# Patient Record
Sex: Male | Born: 1940 | Race: White | Hispanic: No | Smoking: Former smoker
Health system: Southern US, Community
[De-identification: ages and names within clinical notes are randomized; demographics above are authoritative.]

## PROBLEM LIST (undated history)

## (undated) DIAGNOSIS — Z8669 Personal history of other diseases of the nervous system and sense organs: Secondary | ICD-10-CM

## (undated) DIAGNOSIS — I10 Essential (primary) hypertension: Secondary | ICD-10-CM

## (undated) DIAGNOSIS — E7800 Pure hypercholesterolemia, unspecified: Secondary | ICD-10-CM

## (undated) DIAGNOSIS — N529 Male erectile dysfunction, unspecified: Secondary | ICD-10-CM

## (undated) DIAGNOSIS — K8020 Calculus of gallbladder without cholecystitis without obstruction: Secondary | ICD-10-CM

## (undated) DIAGNOSIS — R0609 Other forms of dyspnea: Secondary | ICD-10-CM

## (undated) DIAGNOSIS — J449 Chronic obstructive pulmonary disease, unspecified: Secondary | ICD-10-CM

## (undated) DIAGNOSIS — K625 Hemorrhage of anus and rectum: Secondary | ICD-10-CM

## (undated) DIAGNOSIS — G4700 Insomnia, unspecified: Secondary | ICD-10-CM

## (undated) HISTORY — DX: Pure hypercholesterolemia, unspecified: E78.00

## (undated) HISTORY — DX: Other forms of dyspnea: R06.09

## (undated) HISTORY — DX: Hemorrhage of anus and rectum: K62.5

## (undated) HISTORY — DX: Calculus of gallbladder without cholecystitis without obstruction: K80.20

## (undated) HISTORY — DX: Chronic obstructive pulmonary disease, unspecified: J44.9

## (undated) HISTORY — PX: OTHER SURGICAL HISTORY: SHX169

## (undated) HISTORY — DX: Insomnia, unspecified: G47.00

## (undated) HISTORY — DX: Male erectile dysfunction, unspecified: N52.9

## (undated) HISTORY — DX: Personal history of other diseases of the nervous system and sense organs: Z86.69

## (undated) HISTORY — DX: Essential (primary) hypertension: I10

---

## 2000-08-18 ENCOUNTER — Encounter: Payer: Self-pay | Admitting: Family Medicine

## 2000-08-18 ENCOUNTER — Ambulatory Visit (HOSPITAL_COMMUNITY): Admission: RE | Admit: 2000-08-18 | Discharge: 2000-08-18 | Payer: Self-pay | Admitting: Family Medicine

## 2001-09-19 ENCOUNTER — Emergency Department (HOSPITAL_COMMUNITY): Admission: EM | Admit: 2001-09-19 | Discharge: 2001-09-19 | Payer: Self-pay | Admitting: Emergency Medicine

## 2001-09-28 ENCOUNTER — Emergency Department (HOSPITAL_COMMUNITY): Admission: EM | Admit: 2001-09-28 | Discharge: 2001-09-28 | Payer: Self-pay | Admitting: Emergency Medicine

## 2002-12-13 HISTORY — PX: COLONOSCOPY: SHX174

## 2002-12-24 ENCOUNTER — Ambulatory Visit (HOSPITAL_COMMUNITY): Admission: RE | Admit: 2002-12-24 | Discharge: 2002-12-24 | Payer: Self-pay | Admitting: Internal Medicine

## 2004-06-15 ENCOUNTER — Ambulatory Visit (HOSPITAL_COMMUNITY): Admission: RE | Admit: 2004-06-15 | Discharge: 2004-06-15 | Payer: Self-pay | Admitting: Family Medicine

## 2007-04-03 ENCOUNTER — Ambulatory Visit (HOSPITAL_COMMUNITY): Admission: RE | Admit: 2007-04-03 | Discharge: 2007-04-03 | Payer: Self-pay | Admitting: Family Medicine

## 2007-04-08 ENCOUNTER — Ambulatory Visit (HOSPITAL_COMMUNITY): Admission: RE | Admit: 2007-04-08 | Discharge: 2007-04-08 | Payer: Self-pay | Admitting: Family Medicine

## 2007-04-14 ENCOUNTER — Ambulatory Visit (HOSPITAL_COMMUNITY): Admission: RE | Admit: 2007-04-14 | Discharge: 2007-04-14 | Payer: Self-pay | Admitting: Family Medicine

## 2009-01-30 ENCOUNTER — Encounter (HOSPITAL_COMMUNITY): Admission: RE | Admit: 2009-01-30 | Discharge: 2009-02-08 | Payer: Self-pay | Admitting: Pediatrics

## 2010-02-11 DIAGNOSIS — Z8669 Personal history of other diseases of the nervous system and sense organs: Secondary | ICD-10-CM

## 2010-02-11 HISTORY — DX: Personal history of other diseases of the nervous system and sense organs: Z86.69

## 2010-06-29 NOTE — Op Note (Signed)
NAME:  Jerome Gonzalez, Jerome Gonzalez                             ACCOUNT NO.:  [id number]   MEDICAL RECORD NO.:  [id number]                   PATIENT TYPE:  AMB   LOCATION:  DAY                                  FACILITY:  [facility]   PHYSICIAN:  R. Roetta Sessions, M.D.              DATE OF BIRTH:  05-16-1940   DATE OF PROCEDURE:  12/24/2002  DATE OF DISCHARGE:                                 OPERATIVE REPORT   PROCEDURE:  Screening colonoscopy.   ENDOSCOPIST:  Gerrit Friends. Rourk, M.D.   INDICATIONS FOR PROCEDURE:  The patient is a 70 year old gentleman who has  never had his lower GI tract imaged. He is sent over at the courtesy of Dr.  Nicoletta Ba for colorectal cancer screening.  Mr. Cocuzza has never had a  colonoscopy.  There is no family history of colorectal neoplasia.  He tells  me that he has a very small volume hematochezia with almost every stool and  this has been the case for many years.  He has 1 formed stool daily.  He  denies constipation or diarrhea. Colonoscopy is now being done. This  approach has been discussed with the patient at length at the bedside.  The  potential risks, benefits, and alternatives have been reviewed, questions  answered. He is agreeable.  Please see my handwritten H&P for more  information.   PROCEDURE NOTE:  O2 saturation, blood pressure, pulse and respirations were  monitored throughout the entire procedure.  Conscious sedation: Versed 5 mg IV, Demerol 100 mg IV in divided doses.   INSTRUMENT:  Olympus video chip adult colonoscope.   FINDINGS:  A digital rectal exam revealed no abnormalities.   ENDOSCOPIC FINDINGS:  The prep was adequate.   RECTUM:  Examination of the rectal mucosa including the retroflex view of  the anal verge and on-face view of the anal canal revealed friable anal  canal hemorrhoids; otherwise the rectal mucosa appeared normal.   COLON:  The colonic mucosa was surveyed from the rectosigmoid junction  through the left transverse and right  colon to the area of the appendiceal  orifice, ileocecal valve, and cecum.  These structures were well seen and  photographed for the record.  The patient had a few scattered left-sided  diverticula.  The remainder of the colonic mucosa to the cecum appeared  normal.   From the level of the cecum and ileocecal valve the scope was slowly  withdrawn.  All previously mentioned mucosal surfaces were again seen; no  other abnormalities were observed.  The patient tolerated the procedure well  and was reacted in endoscopy.   IMPRESSION:  1. Friable anal canal hemorrhoids otherwise normal rectum.  2. Left-sided diverticula.  3. The remainder of the colonic mucosa appeared normal.   RECOMMENDATIONS:  1. Hemorrhoid/diverticulosis literature provided to Mr. Near.  2. A 10-day course of Anusol suppositories HC, 1 per rectum at bedtime.  3. Repeat colonoscopy in 10 years.      ___________________________________________                                            Jonathon Bellows, M.D.   RMR/MEDQ  D:  12/24/2002  T:  12/24/2002  Job:  [id number]   cc:   Jeoffrey Massed, M.D.  [facility]  [place], [place] [address]  Fax: [phone]

## 2010-11-01 LAB — PULMONARY FUNCTION TEST

## 2010-11-12 DIAGNOSIS — R0600 Dyspnea, unspecified: Secondary | ICD-10-CM

## 2010-11-12 DIAGNOSIS — R0609 Other forms of dyspnea: Secondary | ICD-10-CM

## 2010-11-12 HISTORY — DX: Dyspnea, unspecified: R06.00

## 2010-11-12 HISTORY — DX: Other forms of dyspnea: R06.09

## 2011-04-12 DIAGNOSIS — K625 Hemorrhage of anus and rectum: Secondary | ICD-10-CM

## 2011-04-12 HISTORY — DX: Hemorrhage of anus and rectum: K62.5

## 2011-04-25 LAB — CBC WITH DIFFERENTIAL/PLATELET
Hemoglobin: 15.3 g/dL (ref 13.5–17.5)
MCV: 95.3 fL
RBC: 4.59
WBC: 6.1
platelet count: 228

## 2011-04-29 ENCOUNTER — Encounter: Payer: Self-pay | Admitting: Gastroenterology

## 2011-04-29 ENCOUNTER — Ambulatory Visit (INDEPENDENT_AMBULATORY_CARE_PROVIDER_SITE_OTHER): Payer: Medicare Other | Admitting: Gastroenterology

## 2011-04-29 VITALS — HR 93 | Temp 98.3°F | Ht 74.0 in | Wt 300.4 lb

## 2011-04-29 DIAGNOSIS — K921 Melena: Secondary | ICD-10-CM

## 2011-04-29 NOTE — Patient Instructions (Signed)
We have set you up for a colonoscopy with Dr. Rourk in the near future.  Further recommendations to follow once this is completed.  

## 2011-04-29 NOTE — Progress Notes (Signed)
Primary Care Physician:  Vivia Ewing, MD, MD Primary Gastroenterologist:  Dr. Jena Gauss   Chief Complaint  Patient presents with  . Rectal Bleeding    HPI:   [name] is a pleasant 71 year old male who presents today at the request of Dr. Milford Cage secondary to rectal bleeding. Last TCS in Nov 2004 with friable anal canal and left-sided diverticula. He reports intermittent, low-volume, painless hematochezia. No change in bowel habits, no diarrhea. Denies abdominal pain, wt loss, lack of appetite. No FH of colon cancer. Denies any N/V. Has very rare indigestion secondary to food choices. Denies dysphagia.    Past Medical History  Diagnosis Date  . Hypercholesterolemia   . Hypertension   . Diabetes mellitus     Past Surgical History  Procedure Date  . Foot surgeries     X 3, as child  . Colonoscopy Nov 2004    RMR: friable anal canal, left-sided diverticula    Current Outpatient Prescriptions  Medication Sig Dispense Refill  . aspirin 81 MG tablet Take 81 mg by mouth daily.      . fish oil-omega-3 fatty acids 1000 MG capsule Take 2 g by mouth daily.      . fluticasone (FLONASE) 50 MCG/ACT nasal spray Place 2 sprays into the nose daily.      Marland Kitchen lisinopril (PRINIVIL,ZESTRIL) 20 MG tablet Take 20 mg by mouth daily.       Marland Kitchen lovastatin (MEVACOR) 40 MG tablet Take 40 mg by mouth at bedtime.       . metFORMIN (GLUCOPHAGE) 500 MG tablet Take 500 mg by mouth 2 (two) times daily with a meal.       . Multiple Vitamin (MULTIVITAMIN) capsule Take 1 capsule by mouth daily.      . nabumetone (RELAFEN) 750 MG tablet Take 750 mg by mouth 2 (two) times daily.         Allergies as of 04/29/2011  . (No Known Allergies)    Family History  Problem Relation Age of Onset  . Colon cancer Neg Hx     History   Social History  . Marital Status: Divorced    Spouse Name: N/A    Number of Children: N/A  . Years of Education: N/A   Occupational History  . Not on file.   Social History Main Topics    . Smoking status: Former Smoker -- 1.0 packs/day    Types: Cigarettes  . Smokeless tobacco: Former Neurosurgeon    Quit date: 03/01/1983  . Alcohol Use: No  . Drug Use: No  . Sexually Active: Not on file   Other Topics Concern  . Not on file   Social History Narrative  . No narrative on file    Review of Systems: Gen: Denies any fever, chills, fatigue, weight loss, lack of appetite.  CV: Denies chest pain, heart palpitations, peripheral edema, syncope.  Resp: Denies shortness of breath at rest or with exertion. Denies wheezing or cough.  GI: Denies dysphagia or odynophagia. Denies jaundice, hematemesis, fecal incontinence. GU : Denies urinary burning, urinary frequency, urinary hesitancy MS: Denies joint pain, muscle weakness, cramps, or limitation of movement.  Derm: Denies rash, itching, dry skin Psych: Denies depression, anxiety, memory loss, and confusion Heme: Denies bruising, bleeding, and enlarged lymph nodes.  Physical Exam: Pulse 93  Temp(Src) 98.3 F (36.8 C) (Temporal)  Ht 6\' 2"  (1.88 m)  Wt 300 lb 6.4 oz (136.261 kg)  BMI 38.57 kg/m2 General:   Alert and oriented. Pleasant and cooperative.  Well-nourished and well-developed.  Head:  Normocephalic and atraumatic. Eyes:  Without icterus, sclera clear and conjunctiva pink.  Ears:  Normal auditory acuity. Nose:  No deformity, discharge,  or lesions. Mouth:  No deformity or lesions, oral mucosa pink.  Neck:  Supple, without mass or thyromegaly. Lungs:  Clear to auscultation bilaterally. No wheezes, rales, or rhonchi. No distress.  Heart:  S1, S2 present without murmurs appreciated.  Abdomen:  +BS, soft, non-tender and non-distended. No HSM noted. No guarding or rebound. No masses appreciated.  Rectal:  Deferred  Msk:  Symmetrical without gross deformities. Normal posture. Extremities:  Without clubbing or edema. Neurologic:  Alert and  oriented x4;  grossly normal neurologically. Skin:  Intact without significant  lesions or rashes. Cervical Nodes:  No significant cervical adenopathy. Psych:  Alert and cooperative. Normal mood and affect.

## 2011-04-30 ENCOUNTER — Encounter: Payer: Self-pay | Admitting: Gastroenterology

## 2011-04-30 DIAGNOSIS — K921 Melena: Secondary | ICD-10-CM | POA: Insufficient documentation

## 2011-04-30 NOTE — Assessment & Plan Note (Signed)
71 year old male with low-volume, painless hematochezia without any associated symptoms such as abdominal pain, change in bowel habits. Last TCS in 2004 with friable anal canal and left-sided diverticula. At this point, it is highly unlikely this is due to an underlying occult process; however, enough time has passed between lower GI evaluations that an updated colonoscopy could be warranted. He would like to proceed in this direction.   Proceed with TCS with Dr. Jena Gauss in near future: the risks, benefits, and alternatives have been discussed with the patient in detail. The patient states understanding and desires to proceed.

## 2011-04-30 NOTE — Progress Notes (Signed)
Faxed to PCP

## 2011-05-01 ENCOUNTER — Other Ambulatory Visit: Payer: Self-pay

## 2011-05-01 DIAGNOSIS — K625 Hemorrhage of anus and rectum: Secondary | ICD-10-CM

## 2011-05-02 ENCOUNTER — Telehealth: Payer: Self-pay

## 2011-05-02 NOTE — Telephone Encounter (Signed)
Pt is scheduled for his colonoscopy with Dr. Jena Gauss on 05/08/2011 @ 1:15 PM. Jeanene Erb and informed pt of his date/time. Rx and instructions faxed to [facility] in [place].

## 2011-05-07 ENCOUNTER — Telehealth: Payer: Self-pay | Admitting: Internal Medicine

## 2011-05-07 NOTE — Telephone Encounter (Signed)
Pt cancelled his TCS and wanted to try medication first. He said AS was going to call something in for him but Rx does not have Rx. Please call patient at [phone]

## 2011-05-08 ENCOUNTER — Ambulatory Visit (HOSPITAL_COMMUNITY): Admission: RE | Admit: 2011-05-08 | Payer: Medicare Other | Source: Ambulatory Visit | Admitting: Internal Medicine

## 2011-05-08 ENCOUNTER — Encounter (HOSPITAL_COMMUNITY): Admission: RE | Payer: Self-pay | Source: Ambulatory Visit

## 2011-05-08 SURGERY — COLONOSCOPY
Anesthesia: Moderate Sedation

## 2011-05-08 MED ORDER — HYDROCORTISONE ACETATE 25 MG RE SUPP: 25.0000 mg | Freq: Two times a day (BID) | RECTAL | Status: DC

## 2011-05-08 NOTE — Telephone Encounter (Signed)
I sent in a 7 day course of BID anusol suppositories.  Let's have pt come back in 6 weeks for reassessment.

## 2011-05-09 NOTE — Telephone Encounter (Signed)
Pt is aware and f/u opv for 06/19/11 w/AS

## 2011-06-19 ENCOUNTER — Ambulatory Visit (INDEPENDENT_AMBULATORY_CARE_PROVIDER_SITE_OTHER): Payer: Medicare Other | Admitting: Gastroenterology

## 2011-06-19 ENCOUNTER — Encounter: Payer: Self-pay | Admitting: Gastroenterology

## 2011-06-19 VITALS — BP 156/86 | HR 84 | Temp 98.4°F | Ht 74.0 in | Wt 298.2 lb

## 2011-06-19 DIAGNOSIS — K5900 Constipation, unspecified: Secondary | ICD-10-CM | POA: Insufficient documentation

## 2011-06-19 DIAGNOSIS — K921 Melena: Secondary | ICD-10-CM

## 2011-06-19 NOTE — Assessment & Plan Note (Signed)
Rare. Associated with eating "too much cheese". Discussed HF diet. Avoid cheese. May take stool softener prn. Miralax prn. 6 mos f/u.

## 2011-06-19 NOTE — Progress Notes (Signed)
Referring Provider: Ara Kussmaul, MD Primary Care Physician:  Vivia Ewing, MD, MD Primary Gastroenterologist: Dr. Jena Gauss   Chief Complaint  Patient presents with  . Follow-up    HPI:   [name] is a pleasant 71 year old male who presents today in follow-up for low-volume, painless hematochezia. Seen March 2013 and set up for TCS. However, pt decided to try anusol suppositires first. This has significantly improved his symptoms. Notes almost complete resolution of rectal bleeding. Rare constipation, usually associated with too much cheese.  Last TCS in Nov 2004 with friable anal canal and left-sided diverticula.    Denies abdominal pain, wt loss, lack of appetite. No FH of colon cancer. Denies any N/V. Has very rare indigestion secondary to food choices. Denies dysphagia.   Labs reviewed from Dr. Milford Cage: Hgb 15.14 April 2011.   Past Medical History  Diagnosis Date  . Hypercholesterolemia   . Hypertension   . Diabetes mellitus     Past Surgical History  Procedure Date  . Foot surgeries     X 3, as child  . Colonoscopy Nov 2004    RMR: friable anal canal, left-sided diverticula    Current Outpatient Prescriptions  Medication Sig Dispense Refill  . aspirin 81 MG tablet Take 81 mg by mouth daily.      . fish oil-omega-3 fatty acids 1000 MG capsule Take 2 g by mouth daily.      . fluticasone (FLONASE) 50 MCG/ACT nasal spray Place 2 sprays into the nose daily.      . hydrocortisone (ANUSOL-HC) 25 MG suppository Place 1 suppository (25 mg total) rectally every 12 (twelve) hours. For 7 days  14 suppository  0  . lisinopril (PRINIVIL,ZESTRIL) 20 MG tablet Take 20 mg by mouth daily.       Marland Kitchen lovastatin (MEVACOR) 40 MG tablet Take 40 mg by mouth at bedtime.       . metFORMIN (GLUCOPHAGE) 500 MG tablet Take 500 mg by mouth 2 (two) times daily with a meal.       . Multiple Vitamin (MULTIVITAMIN) capsule Take 1 capsule by mouth daily.      . nabumetone (RELAFEN) 750 MG tablet Take 750 mg by  mouth 2 (two) times daily.         Allergies as of 06/19/2011  . (No Known Allergies)    Family History  Problem Relation Age of Onset  . Colon cancer Neg Hx     History   Social History  . Marital Status: Divorced    Spouse Name: N/A    Number of Children: N/A  . Years of Education: N/A   Social History Main Topics  . Smoking status: Former Smoker -- 1.0 packs/day    Types: Cigarettes  . Smokeless tobacco: Former Neurosurgeon    Quit date: 03/01/1983  . Alcohol Use: No  . Drug Use: No  . Sexually Active: None   Other Topics Concern  . None   Social History Narrative  . None    Review of Systems: Gen: Denies fever, chills, anorexia. Denies fatigue, weakness, weight loss.  CV: Denies chest pain, palpitations, syncope, peripheral edema, and claudication. Resp: Denies dyspnea at rest, cough, wheezing, coughing up blood, and pleurisy. GI: Denies vomiting blood, jaundice, and fecal incontinence.   Denies dysphagia or odynophagia. Derm: Denies rash, itching, dry skin Psych: Denies depression, anxiety, memory loss, confusion. No homicidal or suicidal ideation.  Heme: Denies bruising, bleeding, and enlarged lymph nodes.  Physical Exam: BP 156/86  Pulse 84  Temp(Src) 98.4 F (36.9 C) (Temporal)  Ht 6\' 2"  (1.88 m)  Wt 298 lb 3.2 oz (135.263 kg)  BMI 38.29 kg/m2 General:   Alert and oriented. No distress noted. Pleasant and cooperative.  Head:  Normocephalic and atraumatic. Eyes:  Conjuctiva clear without scleral icterus. Mouth:  Oral mucosa pink and moist. Good dentition. No lesions. Heart:  S1, S2 present without murmurs, rubs, or gallops. Regular rate and rhythm. Abdomen:  +BS, soft, non-tender and non-distended. No rebound or guarding. No HSM or masses noted. Msk:  Symmetrical without gross deformities. Normal posture. Extremities:  Without edema. Neurologic:  Alert and  oriented x4;  grossly normal neurologically. Skin:  Intact without significant lesions or  rashes. Psych:  Alert and cooperative. Normal mood and affect.

## 2011-06-19 NOTE — Patient Instructions (Signed)
Eat a high fiber diet and drink plenty of water as you are doing. Avoid foods that constipate you (like cheese)  You may take an over-the-counter stool softener 1-2 times a day as needed. If you really need help with constipation, you may take Miralax. If you notice any significant change in your bowel habits, call our office. If you notice any further or increased bleeding, call us as well.  Otherwise, we will see you back in 6 months!

## 2011-06-19 NOTE — Assessment & Plan Note (Signed)
71 year old male initially seen in consult March 2013 due to painless low-volume hematochezia. No other associated symptoms. Last TCS 2004 with friable anal canal and left-sided diverticula. Was set up for TCS but called to cancel and try suppositories instead.  Anusol prescribed, with almost complete resolution of rectal bleeding. Notes only once incidence with constipation after straining. No anemia March 2013. Likely due to benign source. Pt would like to hold off on lower GI evaluation, which I believe at this point is not out of the question. I have discussed with him signs/symptoms to report. Will definitely need TCS next year. Proceed with TCS if further rectal bleeding.

## 2011-06-20 ENCOUNTER — Encounter: Payer: Self-pay | Admitting: Internal Medicine

## 2011-06-20 NOTE — Progress Notes (Signed)
Faxed to PCP

## 2011-06-20 NOTE — Progress Notes (Signed)
Please inform pt CBC normal. No anemia. Contact us if further rectal bleeding. Return as already discussed. Hold off on TCS for now, but he will definitely need if any further rectal bleeding, change in bowel habits. TCS for next year as planned.

## 2011-06-25 NOTE — Progress Notes (Signed)
Tried to call pt- LMOM 

## 2011-06-26 NOTE — Progress Notes (Signed)
Pt called this morning and is aware of results,

## 2011-06-27 ENCOUNTER — Encounter: Payer: Self-pay | Admitting: General Practice

## 2011-06-27 ENCOUNTER — Telehealth: Payer: Self-pay | Admitting: Internal Medicine

## 2011-06-27 NOTE — Telephone Encounter (Signed)
Spoke with pt. Still dealing with constipation. Add metamucil daily. Stool softener 1-2 daily. Miralax every other day. Discussed with him need for titrating as necessary. Pt due for TCS next year. Wanted to hold off on TCS, see OV note.  Pt to return in 6 mos.

## 2011-06-27 NOTE — Telephone Encounter (Signed)
Patient called this morning wanting to speak with AS. Please call patient back at [phone]

## 2011-10-22 ENCOUNTER — Encounter: Payer: Self-pay | Admitting: Family Medicine

## 2011-10-22 ENCOUNTER — Ambulatory Visit (INDEPENDENT_AMBULATORY_CARE_PROVIDER_SITE_OTHER): Payer: Medicare Other | Admitting: Family Medicine

## 2011-10-22 VITALS — BP 162/98 | HR 79 | Temp 97.4°F | Ht 74.0 in | Wt 295.0 lb

## 2011-10-22 DIAGNOSIS — I10 Essential (primary) hypertension: Secondary | ICD-10-CM | POA: Insufficient documentation

## 2011-10-22 DIAGNOSIS — Z23 Encounter for immunization: Secondary | ICD-10-CM

## 2011-10-22 DIAGNOSIS — J209 Acute bronchitis, unspecified: Secondary | ICD-10-CM

## 2011-10-22 DIAGNOSIS — E782 Mixed hyperlipidemia: Secondary | ICD-10-CM

## 2011-10-22 DIAGNOSIS — E119 Type 2 diabetes mellitus without complications: Secondary | ICD-10-CM | POA: Insufficient documentation

## 2011-10-22 LAB — CBC WITH DIFFERENTIAL/PLATELET
Basophils Absolute: 0 10*3/uL (ref 0.0–0.1)
Basophils Relative: 0.6 % (ref 0.0–3.0)
Eosinophils Absolute: 0.1 10*3/uL (ref 0.0–0.7)
HCT: 47.8 % (ref 39.0–52.0)
Hemoglobin: 15.9 g/dL (ref 13.0–17.0)
Lymphs Abs: 1.3 10*3/uL (ref 0.7–4.0)
MCHC: 33.2 g/dL (ref 30.0–36.0)
Neutro Abs: 5.1 10*3/uL (ref 1.4–7.7)
RBC: 4.77 Mil/uL (ref 4.22–5.81)
RDW: 13.5 % (ref 11.5–14.6)

## 2011-10-22 LAB — COMPREHENSIVE METABOLIC PANEL
ALT: 51 U/L (ref 0–53)
Albumin: 4.5 g/dL (ref 3.5–5.2)
CO2: 28 mEq/L (ref 19–32)
Calcium: 10 mg/dL (ref 8.4–10.5)
Chloride: 103 mEq/L (ref 96–112)
GFR: 87.43 mL/min (ref >60.00)
Glucose, Bld: 118 mg/dL — ABNORMAL HIGH (ref 70–99)
Potassium: 4.9 mEq/L (ref 3.5–5.1)
Sodium: 139 mEq/L (ref 135–145)
Total Protein: 7.5 g/dL (ref 6.0–8.3)

## 2011-10-22 LAB — LIPID PANEL: Cholesterol: 205 mg/dL — ABNORMAL HIGH (ref 0–200)

## 2011-10-22 MED ORDER — LOVASTATIN 40 MG PO TABS: ORAL_TABLET | ORAL | Status: DC

## 2011-10-22 MED ORDER — ZOSTER VACCINE LIVE 19400 UNT/0.65ML ~~LOC~~ SOLR: 0.6500 mL | Freq: Once | SUBCUTANEOUS | Status: AC

## 2011-10-22 MED ORDER — HYDROCODONE-HOMATROPINE 5-1.5 MG/5ML PO SYRP: ORAL_SOLUTION | ORAL | Status: AC

## 2011-10-22 MED ORDER — AZITHROMYCIN 250 MG PO TABS: 250.0000 mg | ORAL_TABLET | Freq: Every day | ORAL | Status: AC

## 2011-10-22 NOTE — Assessment & Plan Note (Addendum)
FLP and liver tests today. I told him to resume taking his mevacor 40mg  tab TWICE per day instead of qd b/c it sounds like his muscle cramp in right leg are unrelated to statin use.

## 2011-10-22 NOTE — Assessment & Plan Note (Signed)
Prolonged sx's, likely has mild underlying COPD. Z-pack rx'd, hycodan rx'd, mucinex DM recommended. Avoid OTC decongestant med due to possible bp effect.

## 2011-10-22 NOTE — Patient Instructions (Signed)
Buy OTC Mucinex DM and use for daytime cough/congestion. Avoid all OTC med that says "decongestant" on the box. Buy an automatic BP cuff (upper arm cuff, not a wrist cuff) at your pharmacy.

## 2011-10-22 NOTE — Assessment & Plan Note (Signed)
Check HbA1c today. Continue current meds. Get old records to see what monitoring may be needed at this juncture.  Last eye exam was 03/19/2011--will obtain record.

## 2011-10-22 NOTE — Assessment & Plan Note (Signed)
Uncontrolled today, likely affected by OTC sudafed. However, he does not monitor bp at home so I recommended he stop all OTC decongestants, buy bp cuff and monitor bp daily for 1 mo and return for review of these.  Continue current meds for now.  Check lytes/cr today.

## 2011-10-22 NOTE — Progress Notes (Signed)
Office Note 10/22/2011  CC:  Chief Complaint  Patient presents with  . Establish Care    cough, congestion    HPI:  Jerome Gonzalez is a 71 y.o. White male who is here to establish care.   Patient's most recent primary MD: Dr. Milford Cage at [facility] in [place].  I saw him there as well prior to leaving the practice in the fall of 2011. Old records in EPIC/HL were reviewed prior to or during today's visit.  Doing well except for recent respiratory complaints which have been 2 mo in duration: describes yellow/green sticky mucous when he coughs.  Denies SOB, wheeze, or chest tightness.  Flonase spray helps. Describes some nasal/sinus congestion.  Doesn't smoke: quit 1985. No fevers.  No ST or HA.  Has been taking OTC med with decongestant.  No HA, no dizziness, no vision changes. He has chronic hearing loss.    Past Medical History  Diagnosis Date  . Hypercholesterolemia   . Hypertension   . Diabetes mellitus   . COPD (chronic obstructive pulmonary disease)   . Cholelithiasis     Past Surgical History  Procedure Date  . Foot surgeries     X 3, as child  . Colonoscopy Nov 2004    RMR: friable anal canal, left-sided diverticula    Family History  Problem Relation Age of Onset  . Colon cancer Neg Hx     History   Social History  . Marital Status: Divorced    Spouse Name: N/A    Number of Children: N/A  . Years of Education: N/A   Occupational History  . Not on file.   Social History Main Topics  . Smoking status: Former Smoker -- 1.0 packs/day    Types: Cigarettes  . Smokeless tobacco: Former Neurosurgeon    Quit date: 03/01/1983  . Alcohol Use: No  . Drug Use: No  . Sexually Active: Not on file   Other Topics Concern  . Not on file   Social History Narrative  . No narrative on file    Outpatient Encounter Prescriptions as of 10/22/2011  Medication Sig Dispense Refill  . aspirin 81 MG tablet Take 81 mg by mouth daily.      . fish oil-omega-3 fatty acids  1000 MG capsule Take 2 g by mouth daily.      . fluticasone (FLONASE) 50 MCG/ACT nasal spray Place 2 sprays into the nose daily.      . hydrocortisone (ANUSOL-HC) 25 MG suppository Place 1 suppository (25 mg total) rectally every 12 (twelve) hours. For 7 days  14 suppository  0  . lisinopril (PRINIVIL,ZESTRIL) 20 MG tablet Take 20 mg by mouth daily.       Marland Kitchen lovastatin (MEVACOR) 40 MG tablet 1 tab po bid  180 tablet  1  . metFORMIN (GLUCOPHAGE) 500 MG tablet Take 500 mg by mouth 2 (two) times daily with a meal.       . Multiple Vitamin (MULTIVITAMIN) capsule Take 1 capsule by mouth daily.      . nabumetone (RELAFEN) 750 MG tablet Take 750 mg by mouth daily.       Marland Kitchen oxyCODONE-acetaminophen (PERCOCET/ROXICET) 5-325 MG per tablet       . DISCONTD: lovastatin (MEVACOR) 40 MG tablet Take 40 mg by mouth at bedtime.       Marland Kitchen azithromycin (ZITHROMAX) 250 MG tablet Take 1 tablet (250 mg total) by mouth daily.  6 each  0  . HYDROcodone-homatropine (HYCODAN) 5-1.5  MG/5ML syrup 1-2 tsp po q6h prn cough  120 mL  0  . zoster vaccine live, PF, (ZOSTAVAX) 16109 UNT/0.65ML injection Inject 19,400 Units into the skin once.  1 vial  0    No Known Allergies  ROS See HPI PE; Blood pressure 162/98, pulse 79, temperature 97.4 F (36.3 C), temperature source Temporal, height 6\' 2"  (1.88 m), weight 295 lb (133.811 kg), SpO2 94.00%. Gen: Alert, well appearing.  Patient is oriented to person, place, time, and situation. AFFECT: pleasant, lucid thought and speech. VS: noted--normal. Gen: alert, NAD, NONTOXIC APPEARING. HEENT: eyes without injection, drainage, or swelling.  Ears: EACs clear, TMs with normal light reflex and landmarks.  Nose: Clear rhinorrhea, with some dried, crusty exudate adherent to mildly injected mucosa.  No purulent d/c.  No paranasal sinus TTP.  No facial swelling.  Throat and mouth without focal lesion.  No pharyngial swelling, erythema, or exudate.   Neck: supple, no LAD.   LUNGS: CTA bilat,  diminished breath sounds diffusely, nonlabored resps.  Minimal, if any, prolongation of expiratory phase. CV: RRR, no m/r/g. EXT: no c/c/e SKIN: no rash   Pertinent labs:  None today  ASSESSMENT AND PLAN:   New/Transfer PT from [facility]:  Acute bronchitis Prolonged sx's, likely has mild underlying COPD. Z-pack rx'd, hycodan rx'd, mucinex DM recommended. Avoid OTC decongestant med due to possible bp effect.  HTN (hypertension), benign Uncontrolled today, likely affected by OTC sudafed. However, he does not monitor bp at home so I recommended he stop all OTC decongestants, buy bp cuff and monitor bp daily for 1 mo and return for review of these.  Continue current meds for now.  Check lytes/cr today.  Hyperlipemia, mixed FLP and liver tests today. I told him to resume taking his mevacor 40mg  tab TWICE per day instead of qd b/c it sounds like his muscle cramp in right leg are unrelated to statin use.  Type II or unspecified type diabetes mellitus without mention of complication, not stated as uncontrolled Check HbA1c today. Continue current meds. Get old records to see what monitoring may be needed at this juncture.  Last eye exam was 03/19/2011--will obtain record.   Zostavax rx given to pt today. Gave pneumovax and influenza vaccines here today.  Return in about 4 weeks (around 11/19/2011) for f/u HTN.

## 2011-10-28 ENCOUNTER — Other Ambulatory Visit: Payer: Self-pay | Admitting: Family Medicine

## 2011-10-28 MED ORDER — ATORVASTATIN CALCIUM 10 MG PO TABS: 10.0000 mg | ORAL_TABLET | Freq: Every day | ORAL | Status: DC

## 2011-10-29 ENCOUNTER — Encounter: Payer: Self-pay | Admitting: Family Medicine

## 2011-10-30 ENCOUNTER — Encounter: Payer: Self-pay | Admitting: Family Medicine

## 2011-10-30 NOTE — Telephone Encounter (Signed)
Immunizations updated with zostavax.  [facility] office for Dr. Nile Riggs will open at 1pm.  I will call back at that time to see when patients next eye exam should be.

## 2011-11-02 ENCOUNTER — Encounter: Payer: Self-pay | Admitting: Family Medicine

## 2011-11-05 ENCOUNTER — Encounter: Payer: Self-pay | Admitting: Family Medicine

## 2011-11-06 ENCOUNTER — Other Ambulatory Visit: Payer: Self-pay | Admitting: Family Medicine

## 2011-11-06 MED ORDER — LEVOFLOXACIN 500 MG PO TABS: 500.0000 mg | ORAL_TABLET | Freq: Every day | ORAL | Status: DC

## 2011-11-06 NOTE — Telephone Encounter (Signed)
Please advise 

## 2011-11-07 ENCOUNTER — Encounter: Payer: Self-pay | Admitting: Family Medicine

## 2011-11-12 ENCOUNTER — Telehealth: Payer: Self-pay | Admitting: Family Medicine

## 2011-11-12 ENCOUNTER — Encounter: Payer: Self-pay | Admitting: Family Medicine

## 2011-11-12 NOTE — Telephone Encounter (Signed)
Patient was expecting 6 Azithromyzin 250 MG but he received 10 Levofloxacin 500 MG. Are these the same meds? Patient is okay for a CB 11/13/11 not 11/12/11

## 2011-11-12 NOTE — Telephone Encounter (Signed)
Please advise mychart question? 

## 2011-11-13 NOTE — Telephone Encounter (Signed)
Notified pt meds are different.  We are using different med as first round was not completely effective.

## 2011-11-19 ENCOUNTER — Encounter: Payer: Self-pay | Admitting: Family Medicine

## 2011-11-19 ENCOUNTER — Encounter: Payer: Self-pay | Admitting: *Deleted

## 2011-11-20 ENCOUNTER — Encounter: Payer: Self-pay | Admitting: Family Medicine

## 2011-11-20 ENCOUNTER — Ambulatory Visit (INDEPENDENT_AMBULATORY_CARE_PROVIDER_SITE_OTHER): Payer: Medicare Other | Admitting: Family Medicine

## 2011-11-20 VITALS — BP 148/82 | HR 82 | Ht 74.0 in | Wt 295.0 lb

## 2011-11-20 DIAGNOSIS — I10 Essential (primary) hypertension: Secondary | ICD-10-CM

## 2011-11-20 NOTE — Progress Notes (Signed)
OFFICE NOTE  11/20/2011  CC:  Chief Complaint  Patient presents with  . Follow-up    HTN, DM     HPI: Patient is a 71 y.o. Caucasian male who is here for 1 mo f/u HTN.  Got his own cuff but measuring high when compared with ours today but he has a cuff that is questionably too small for his upper arm.  He feels well and has no physical complaints today. Review of home bp over the last 3 wks with this cuff show systolics avg 150s, diastolics avg 80s.  Pertinent PMH:  Past Medical History  Diagnosis Date  . Hypercholesterolemia   . Hypertension   . Diabetes mellitus   . COPD (chronic obstructive pulmonary disease)   . Cholelithiasis     MEDS:  Outpatient Prescriptions Prior to Visit  Medication Sig Dispense Refill  . aspirin 81 MG tablet Take 81 mg by mouth daily.      Marland Kitchen atorvastatin (LIPITOR) 10 MG tablet Take 1 tablet (10 mg total) by mouth daily.  90 tablet  0  . fish oil-omega-3 fatty acids 1000 MG capsule Take 2 g by mouth daily.      . fluticasone (FLONASE) 50 MCG/ACT nasal spray Place 2 sprays into the nose daily.      . hydrocortisone (ANUSOL-HC) 25 MG suppository Place 1 suppository (25 mg total) rectally every 12 (twelve) hours. For 7 days  14 suppository  0  . levofloxacin (LEVAQUIN) 500 MG tablet Take 1 tablet (500 mg total) by mouth daily.  10 tablet  0  . lisinopril (PRINIVIL,ZESTRIL) 20 MG tablet Take 20 mg by mouth daily.       . metFORMIN (GLUCOPHAGE) 500 MG tablet Take 500 mg by mouth 2 (two) times daily with a meal.       . Multiple Vitamin (MULTIVITAMIN) capsule Take 1 capsule by mouth daily.      . nabumetone (RELAFEN) 750 MG tablet Take 750 mg by mouth daily.       Marland Kitchen oxyCODONE-acetaminophen (PERCOCET/ROXICET) 5-325 MG per tablet         PE: Blood pressure 148/82, pulse 82, height 6\' 2"  (1.88 m), weight 295 lb (133.811 kg). Gen: Alert, well appearing.  Patient is oriented to person, place, time, and situation. No further exam today.  No results found  for this basename: TSH   Lab Results  Component Value Date   WBC 7.0 10/22/2011   HGB 15.9 10/22/2011   HCT 47.8 10/22/2011   MCV 100.1* 10/22/2011   PLT 251.0 10/22/2011   Lab Results  Component Value Date   CREATININE 0.9 10/22/2011   BUN 15 10/22/2011   NA 139 10/22/2011   K 4.9 10/22/2011   CL 103 10/22/2011   CO2 28 10/22/2011   Lab Results  Component Value Date   ALT 51 10/22/2011   AST 34 10/22/2011   ALKPHOS 55 10/22/2011   BILITOT 0.6 10/22/2011   Lab Results  Component Value Date   CHOL 205* 10/22/2011   Lab Results  Component Value Date   HDL 54.60 10/22/2011   No results found for this basename: LDLCALC   Lab Results  Component Value Date   TRIG 125.0 10/22/2011   Lab Results  Component Value Date   CHOLHDL 4 10/22/2011   No results found for this basename: PSA       IMPRESSION AND PLAN:  HTN (hypertension), benign His cuff doesn't seem to be all that small for his arm but his  readings with it have systolics 30 points higher consistently compared to both our automated bp machine and our wall mounted bp machine in room. He'll either take 30 points off the systolic readings he gets on his machine OR he'll look into trying a little bigger cuff with his home machine. No med changes today.  If he gets new machine or cuff he'll come in for nurse visit to compare with ours. F/u 1 mo to recheck BP and also get repeat FLP after being switched from mevacor to atorvastatin.   An After Visit Summary was printed and given to the patient.   FOLLOW UP: 1 mo

## 2011-11-20 NOTE — Assessment & Plan Note (Signed)
His cuff doesn't seem to be all that small for his arm but his readings with it have systolics 30 points higher consistently compared to both our automated bp machine and our wall mounted bp machine in room. He'll either take 30 points off the systolic readings he gets on his machine OR he'll look into trying a little bigger cuff with his home machine. No med changes today.  If he gets new machine or cuff he'll come in for nurse visit to compare with ours. F/u 1 mo to recheck BP and also get repeat FLP after being switched from mevacor to atorvastatin.

## 2011-11-20 NOTE — Patient Instructions (Signed)
NO change in bp med. Your goal bp is <130 on top and <80 on bottom. Continue home bp monitoring.

## 2011-11-21 ENCOUNTER — Encounter: Payer: Self-pay | Admitting: Family Medicine

## 2011-11-25 ENCOUNTER — Encounter: Payer: Self-pay | Admitting: Family Medicine

## 2011-11-26 ENCOUNTER — Encounter: Payer: Self-pay | Admitting: Family Medicine

## 2011-11-26 NOTE — Telephone Encounter (Signed)
Stephanie: please call pt and tell him I recommend he come in for o/v with me and we'll get a chest x-ray and pulmonary function tests.   Please also do eRx for prednisone 20mg , 2 tabs po qd x 5d, then 1 tab po qd x 5d, #15, no RF.  Take mucinex DM OTC for cough.

## 2011-11-27 NOTE — Telephone Encounter (Signed)
Spoke with patient.  Declined earlier appt.  He will wait until follow up appt in November.  He is coughing less, and phlegm is not as much.  He also declines prednisone.  He will continue mucinex DM.

## 2011-12-08 ENCOUNTER — Encounter: Payer: Self-pay | Admitting: Family Medicine

## 2011-12-19 ENCOUNTER — Ambulatory Visit (INDEPENDENT_AMBULATORY_CARE_PROVIDER_SITE_OTHER): Payer: Medicare Other | Admitting: Family Medicine

## 2011-12-19 ENCOUNTER — Encounter: Payer: Self-pay | Admitting: Family Medicine

## 2011-12-19 VITALS — BP 164/83 | HR 71 | Ht 74.0 in | Wt 293.0 lb

## 2011-12-19 DIAGNOSIS — I10 Essential (primary) hypertension: Secondary | ICD-10-CM

## 2011-12-19 DIAGNOSIS — E782 Mixed hyperlipidemia: Secondary | ICD-10-CM

## 2011-12-19 DIAGNOSIS — E119 Type 2 diabetes mellitus without complications: Secondary | ICD-10-CM

## 2011-12-19 DIAGNOSIS — E785 Hyperlipidemia, unspecified: Secondary | ICD-10-CM

## 2011-12-19 LAB — LIPID PANEL
Cholesterol: 186 mg/dL (ref 0–200)
HDL: 42 mg/dL (ref >39.00)
Triglycerides: 123 mg/dL (ref 0.0–149.0)

## 2011-12-19 LAB — MICROALBUMIN / CREATININE URINE RATIO
Creatinine,U: 166.4 mg/dL
Microalb, Ur: 1.6 mg/dL (ref 0.0–1.9)

## 2011-12-19 MED ORDER — METOPROLOL SUCCINATE ER 25 MG PO TB24: ORAL_TABLET | ORAL | Status: DC

## 2011-12-19 NOTE — Progress Notes (Signed)
OFFICE NOTE  12/23/2011  CC:  Chief Complaint  Patient presents with  . Follow-up    HTN     HPI: Patient is a 71 y.o. Caucasian male who is here for 1 mo f/u DM 2, HTN, and hyperlipidemia. Due for FLP s/p switch from mevacor to atorvastatin. Still complaining of throat "phlegm".  Uses mucinex and this helps.  No chest tightness or wheezing.   Says the mucous is yellowish green, occasionally "sticky".  No face pain.  No fevers.  No HA.  No ST.  No hoarseness.  Has only occasionally heartburn. Tolerating atorvastatin fine. Denies burning, tingling, or numbness of feet. Reviewed home bps and glucoses from the last 1 mo that he brought in today. Systolic avg: 150s,  Diastolic avg: 75-80 Fasting glucose avg: 115-120  Pertinent PMH:  Past Medical History  Diagnosis Date  . Hypercholesterolemia   . Hypertension   . Diabetes mellitus   . COPD (chronic obstructive pulmonary disease)   . Cholelithiasis     MEDS:  Outpatient Prescriptions Prior to Visit  Medication Sig Dispense Refill  . aspirin 81 MG tablet Take 81 mg by mouth daily.      Marland Kitchen atorvastatin (LIPITOR) 10 MG tablet Take 1 tablet (10 mg total) by mouth daily.  90 tablet  0  . fish oil-omega-3 fatty acids 1000 MG capsule Take 2 g by mouth daily.      . fluticasone (FLONASE) 50 MCG/ACT nasal spray Place 2 sprays into the nose daily.      Marland Kitchen lisinopril (PRINIVIL,ZESTRIL) 20 MG tablet Take 20 mg by mouth daily.       . metFORMIN (GLUCOPHAGE) 500 MG tablet Take 500 mg by mouth 2 (two) times daily with a meal.       . Multiple Vitamin (MULTIVITAMIN) capsule Take 1 capsule by mouth daily.      . nabumetone (RELAFEN) 750 MG tablet Take 750 mg by mouth daily.       Marland Kitchen oxyCODONE-acetaminophen (PERCOCET/ROXICET) 5-325 MG per tablet       . hydrocortisone (ANUSOL-HC) 25 MG suppository Place 1 suppository (25 mg total) rectally every 12 (twelve) hours. For 7 days  14 suppository  0  . levofloxacin (LEVAQUIN) 500 MG tablet Take 1  tablet (500 mg total) by mouth daily.  10 tablet  0    PE: Blood pressure 164/83, pulse 71, height 6\' 2"  (1.88 m), weight 293 lb (132.904 kg). Gen: Alert, well appearing.  Patient is oriented to person, place, time, and situation. RRR. LUNGS: CTA bilat, nonlabored. EXT: no clubbing, cyanosis, or edema.  FEET: mild callus right heel.  No skin breakdown, erythema, or tenderness.  Toenails normal.  DP/PT pulses 1+ bilat.  Sensation intact to monofilament testing bilaterally.  LAB:  Lab Results  Component Value Date   HGBA1C 6.1 10/22/2011    IMPRESSION AND PLAN:  Type II or unspecified type diabetes mellitus without mention of complication, not stated as uncontrolled Stable.  Continue current med/diet/exercise. Foot exam normal today. Check urine microalb/cr today.  HTN (hypertension), benign Systolic bp not ideally controlled (still stage 1 level on average). Add Toprol XL 25mg  qd. Continue lisinopril. Continue ASA.  Continue to monitor bp at home, goal of <130/80 reiterated.  Hyperlipemia, mixed Check FLP today. Tolerating atorvastatin fine.   Need to mention Tdap and PSA at future appointments.  An After Visit Summary was printed and given to the patient.  FOLLOW UP: 75mo f/u HTN

## 2011-12-23 ENCOUNTER — Encounter: Payer: Self-pay | Admitting: Family Medicine

## 2011-12-23 NOTE — Assessment & Plan Note (Signed)
Stable.  Continue current med/diet/exercise. Foot exam normal today. Check urine microalb/cr today.

## 2011-12-23 NOTE — Assessment & Plan Note (Signed)
Check FLP today. Tolerating atorvastatin fine.

## 2011-12-23 NOTE — Assessment & Plan Note (Signed)
Systolic bp not ideally controlled (still stage 1 level on average). Add Toprol XL 25mg  qd. Continue lisinopril. Continue ASA.  Continue to monitor bp at home, goal of <130/80 reiterated.

## 2011-12-30 ENCOUNTER — Encounter: Payer: Self-pay | Admitting: Family Medicine

## 2012-01-02 ENCOUNTER — Encounter: Payer: Self-pay | Admitting: Family Medicine

## 2012-01-03 ENCOUNTER — Encounter: Payer: Self-pay | Admitting: Family Medicine

## 2012-01-06 ENCOUNTER — Other Ambulatory Visit: Payer: Self-pay | Admitting: Family Medicine

## 2012-01-06 MED ORDER — ATORVASTATIN CALCIUM 20 MG PO TABS: 20.0000 mg | ORAL_TABLET | Freq: Every day | ORAL | Status: AC

## 2012-01-14 ENCOUNTER — Encounter: Payer: Self-pay | Admitting: Family Medicine

## 2012-01-20 ENCOUNTER — Ambulatory Visit: Payer: Medicare Other | Admitting: Family Medicine

## 2012-03-11 ENCOUNTER — Encounter: Payer: Self-pay | Admitting: Family Medicine

## 2012-08-20 ENCOUNTER — Other Ambulatory Visit: Payer: Self-pay

## 2012-09-09 ENCOUNTER — Telehealth: Payer: Self-pay | Admitting: General Practice

## 2012-09-09 NOTE — Telephone Encounter (Signed)
I called the patient and lmom for return call

## 2012-09-09 NOTE — Telephone Encounter (Signed)
Patient called and had a question about a bill he received for his co pay.  He stated he did in fact make that payment.  I told Jerome Gonzalez that I needed to investigate and get back with him

## 2012-09-18 ENCOUNTER — Telehealth: Payer: Self-pay | Admitting: Family Medicine

## 2012-09-18 NOTE — Telephone Encounter (Signed)
Patient has transferred to Dr [facility] since it is closer to his house. Patient's handicap placard is expiring soon & Dr. Margo Aye will not renew it. States there is nothing in the notes he received from our office that we had issued the placard. Please contact patient.

## 2012-09-18 NOTE — Telephone Encounter (Signed)
I don't recall whether I signed for this in the past for him or not. Pls call and ask him why he thinks he needs to have a handicap placard for his car.-thx

## 2012-09-18 NOTE — Telephone Encounter (Signed)
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

## 2012-09-18 NOTE — Telephone Encounter (Signed)
Please advise 

## 2012-09-23 NOTE — Telephone Encounter (Signed)
I spoke with pt and said we had no documentation of his sciatic nerve problem and no record of ever having signed a handicap placard for him in the past. I recommended he discuss this with his new MD at first visit coming up and he expressed understanding and agreed.

## 2012-12-17 ENCOUNTER — Other Ambulatory Visit: Payer: Self-pay

## 2013-05-20 ENCOUNTER — Other Ambulatory Visit: Payer: Self-pay

## 2014-06-13 ENCOUNTER — Encounter: Payer: Self-pay | Admitting: Internal Medicine

## 2014-06-15 DIAGNOSIS — E119 Type 2 diabetes mellitus without complications: Secondary | ICD-10-CM | POA: Diagnosis not present

## 2014-06-15 DIAGNOSIS — E782 Mixed hyperlipidemia: Secondary | ICD-10-CM | POA: Diagnosis not present

## 2014-06-15 DIAGNOSIS — I10 Essential (primary) hypertension: Secondary | ICD-10-CM | POA: Diagnosis not present

## 2014-06-15 DIAGNOSIS — Z125 Encounter for screening for malignant neoplasm of prostate: Secondary | ICD-10-CM | POA: Diagnosis not present

## 2014-06-17 DIAGNOSIS — I10 Essential (primary) hypertension: Secondary | ICD-10-CM | POA: Diagnosis not present

## 2014-06-17 DIAGNOSIS — E782 Mixed hyperlipidemia: Secondary | ICD-10-CM | POA: Diagnosis not present

## 2014-06-17 DIAGNOSIS — E1165 Type 2 diabetes mellitus with hyperglycemia: Secondary | ICD-10-CM | POA: Diagnosis not present

## 2014-08-08 ENCOUNTER — Other Ambulatory Visit: Payer: Self-pay

## 2014-08-30 DIAGNOSIS — H5210 Myopia, unspecified eye: Secondary | ICD-10-CM | POA: Diagnosis not present

## 2014-08-30 DIAGNOSIS — H524 Presbyopia: Secondary | ICD-10-CM | POA: Diagnosis not present

## 2014-09-19 DIAGNOSIS — L0291 Cutaneous abscess, unspecified: Secondary | ICD-10-CM | POA: Diagnosis not present

## 2014-10-05 DIAGNOSIS — L0291 Cutaneous abscess, unspecified: Secondary | ICD-10-CM | POA: Diagnosis not present

## 2014-12-19 DIAGNOSIS — E1165 Type 2 diabetes mellitus with hyperglycemia: Secondary | ICD-10-CM | POA: Diagnosis not present

## 2014-12-19 DIAGNOSIS — I10 Essential (primary) hypertension: Secondary | ICD-10-CM | POA: Diagnosis not present

## 2014-12-19 DIAGNOSIS — E782 Mixed hyperlipidemia: Secondary | ICD-10-CM | POA: Diagnosis not present

## 2014-12-21 DIAGNOSIS — M5430 Sciatica, unspecified side: Secondary | ICD-10-CM | POA: Diagnosis not present

## 2014-12-21 DIAGNOSIS — R945 Abnormal results of liver function studies: Secondary | ICD-10-CM | POA: Diagnosis not present

## 2014-12-21 DIAGNOSIS — R011 Cardiac murmur, unspecified: Secondary | ICD-10-CM | POA: Diagnosis not present

## 2014-12-21 DIAGNOSIS — E6609 Other obesity due to excess calories: Secondary | ICD-10-CM | POA: Diagnosis not present

## 2014-12-21 DIAGNOSIS — E782 Mixed hyperlipidemia: Secondary | ICD-10-CM | POA: Diagnosis not present

## 2014-12-21 DIAGNOSIS — Z23 Encounter for immunization: Secondary | ICD-10-CM | POA: Diagnosis not present

## 2014-12-21 DIAGNOSIS — E119 Type 2 diabetes mellitus without complications: Secondary | ICD-10-CM | POA: Diagnosis not present

## 2014-12-21 DIAGNOSIS — I10 Essential (primary) hypertension: Secondary | ICD-10-CM | POA: Diagnosis not present

## 2015-04-28 DIAGNOSIS — I10 Essential (primary) hypertension: Secondary | ICD-10-CM | POA: Diagnosis not present

## 2015-04-28 DIAGNOSIS — E782 Mixed hyperlipidemia: Secondary | ICD-10-CM | POA: Diagnosis not present

## 2015-04-28 DIAGNOSIS — E1165 Type 2 diabetes mellitus with hyperglycemia: Secondary | ICD-10-CM | POA: Diagnosis not present

## 2015-05-01 DIAGNOSIS — I872 Venous insufficiency (chronic) (peripheral): Secondary | ICD-10-CM | POA: Diagnosis not present

## 2015-05-01 DIAGNOSIS — Z23 Encounter for immunization: Secondary | ICD-10-CM | POA: Diagnosis not present

## 2015-05-01 DIAGNOSIS — E782 Mixed hyperlipidemia: Secondary | ICD-10-CM | POA: Diagnosis not present

## 2015-05-01 DIAGNOSIS — E1165 Type 2 diabetes mellitus with hyperglycemia: Secondary | ICD-10-CM | POA: Diagnosis not present

## 2015-05-01 DIAGNOSIS — R011 Cardiac murmur, unspecified: Secondary | ICD-10-CM | POA: Diagnosis not present

## 2015-10-18 DIAGNOSIS — E782 Mixed hyperlipidemia: Secondary | ICD-10-CM | POA: Diagnosis not present

## 2015-10-18 DIAGNOSIS — E1165 Type 2 diabetes mellitus with hyperglycemia: Secondary | ICD-10-CM | POA: Diagnosis not present

## 2015-10-20 DIAGNOSIS — R945 Abnormal results of liver function studies: Secondary | ICD-10-CM | POA: Diagnosis not present

## 2015-10-20 DIAGNOSIS — R011 Cardiac murmur, unspecified: Secondary | ICD-10-CM | POA: Diagnosis not present

## 2015-10-20 DIAGNOSIS — E1165 Type 2 diabetes mellitus with hyperglycemia: Secondary | ICD-10-CM | POA: Diagnosis not present

## 2015-10-20 DIAGNOSIS — Z23 Encounter for immunization: Secondary | ICD-10-CM | POA: Diagnosis not present

## 2015-10-20 DIAGNOSIS — E782 Mixed hyperlipidemia: Secondary | ICD-10-CM | POA: Diagnosis not present

## 2015-10-20 DIAGNOSIS — M5430 Sciatica, unspecified side: Secondary | ICD-10-CM | POA: Diagnosis not present

## 2015-10-20 DIAGNOSIS — I10 Essential (primary) hypertension: Secondary | ICD-10-CM | POA: Diagnosis not present

## 2015-11-17 DIAGNOSIS — R945 Abnormal results of liver function studies: Secondary | ICD-10-CM | POA: Diagnosis not present

## 2015-11-17 DIAGNOSIS — M545 Low back pain: Secondary | ICD-10-CM | POA: Diagnosis not present

## 2015-11-17 DIAGNOSIS — M25559 Pain in unspecified hip: Secondary | ICD-10-CM | POA: Diagnosis not present

## 2015-12-13 ENCOUNTER — Other Ambulatory Visit (HOSPITAL_COMMUNITY): Payer: Self-pay | Admitting: Internal Medicine

## 2015-12-13 DIAGNOSIS — R945 Abnormal results of liver function studies: Secondary | ICD-10-CM

## 2015-12-21 ENCOUNTER — Ambulatory Visit (HOSPITAL_COMMUNITY)
Admission: RE | Admit: 2015-12-21 | Discharge: 2015-12-21 | Disposition: A | Discharge status: Home / Self Care | Payer: Commercial Managed Care - HMO | Source: Ambulatory Visit | Attending: Internal Medicine | Admitting: Internal Medicine

## 2015-12-21 DIAGNOSIS — R932 Abnormal findings on diagnostic imaging of liver and biliary tract: Secondary | ICD-10-CM | POA: Diagnosis not present

## 2015-12-21 DIAGNOSIS — R7989 Other specified abnormal findings of blood chemistry: Secondary | ICD-10-CM | POA: Diagnosis not present

## 2015-12-21 DIAGNOSIS — K8020 Calculus of gallbladder without cholecystitis without obstruction: Secondary | ICD-10-CM | POA: Diagnosis not present

## 2015-12-21 DIAGNOSIS — R945 Abnormal results of liver function studies: Secondary | ICD-10-CM | POA: Diagnosis not present

## 2016-01-30 DIAGNOSIS — Z0000 Encounter for general adult medical examination without abnormal findings: Secondary | ICD-10-CM | POA: Diagnosis not present

## 2016-02-16 DIAGNOSIS — M545 Low back pain: Secondary | ICD-10-CM | POA: Diagnosis not present

## 2016-02-16 DIAGNOSIS — M25559 Pain in unspecified hip: Secondary | ICD-10-CM | POA: Diagnosis not present

## 2016-02-16 DIAGNOSIS — R945 Abnormal results of liver function studies: Secondary | ICD-10-CM | POA: Diagnosis not present

## 2016-03-18 DIAGNOSIS — I10 Essential (primary) hypertension: Secondary | ICD-10-CM | POA: Diagnosis not present

## 2016-03-18 DIAGNOSIS — E1165 Type 2 diabetes mellitus with hyperglycemia: Secondary | ICD-10-CM | POA: Diagnosis not present

## 2016-03-18 DIAGNOSIS — E782 Mixed hyperlipidemia: Secondary | ICD-10-CM | POA: Diagnosis not present

## 2016-03-20 DIAGNOSIS — R945 Abnormal results of liver function studies: Secondary | ICD-10-CM | POA: Diagnosis not present

## 2016-03-20 DIAGNOSIS — E1165 Type 2 diabetes mellitus with hyperglycemia: Secondary | ICD-10-CM | POA: Diagnosis not present

## 2016-03-20 DIAGNOSIS — Z6837 Body mass index (BMI) 37.0-37.9, adult: Secondary | ICD-10-CM | POA: Diagnosis not present

## 2016-03-20 DIAGNOSIS — E782 Mixed hyperlipidemia: Secondary | ICD-10-CM | POA: Diagnosis not present

## 2016-03-20 DIAGNOSIS — I10 Essential (primary) hypertension: Secondary | ICD-10-CM | POA: Diagnosis not present

## 2016-03-20 DIAGNOSIS — M5430 Sciatica, unspecified side: Secondary | ICD-10-CM | POA: Diagnosis not present

## 2016-06-17 DIAGNOSIS — I10 Essential (primary) hypertension: Secondary | ICD-10-CM | POA: Diagnosis not present

## 2016-06-17 DIAGNOSIS — E782 Mixed hyperlipidemia: Secondary | ICD-10-CM | POA: Diagnosis not present

## 2016-06-17 DIAGNOSIS — E1165 Type 2 diabetes mellitus with hyperglycemia: Secondary | ICD-10-CM | POA: Diagnosis not present

## 2016-06-19 DIAGNOSIS — E782 Mixed hyperlipidemia: Secondary | ICD-10-CM | POA: Diagnosis not present

## 2016-06-19 DIAGNOSIS — R945 Abnormal results of liver function studies: Secondary | ICD-10-CM | POA: Diagnosis not present

## 2016-06-19 DIAGNOSIS — M25559 Pain in unspecified hip: Secondary | ICD-10-CM | POA: Diagnosis not present

## 2016-06-19 DIAGNOSIS — M545 Low back pain: Secondary | ICD-10-CM | POA: Diagnosis not present

## 2016-06-19 DIAGNOSIS — I10 Essential (primary) hypertension: Secondary | ICD-10-CM | POA: Diagnosis not present

## 2016-06-19 DIAGNOSIS — R011 Cardiac murmur, unspecified: Secondary | ICD-10-CM | POA: Diagnosis not present

## 2016-06-19 DIAGNOSIS — E1165 Type 2 diabetes mellitus with hyperglycemia: Secondary | ICD-10-CM | POA: Diagnosis not present

## 2016-06-19 DIAGNOSIS — M5430 Sciatica, unspecified side: Secondary | ICD-10-CM | POA: Diagnosis not present

## 2016-06-19 DIAGNOSIS — E6609 Other obesity due to excess calories: Secondary | ICD-10-CM | POA: Diagnosis not present

## 2016-11-06 DIAGNOSIS — I10 Essential (primary) hypertension: Secondary | ICD-10-CM | POA: Diagnosis not present

## 2016-11-06 DIAGNOSIS — E1165 Type 2 diabetes mellitus with hyperglycemia: Secondary | ICD-10-CM | POA: Diagnosis not present

## 2016-11-08 DIAGNOSIS — E1165 Type 2 diabetes mellitus with hyperglycemia: Secondary | ICD-10-CM | POA: Diagnosis not present

## 2016-11-08 DIAGNOSIS — E782 Mixed hyperlipidemia: Secondary | ICD-10-CM | POA: Diagnosis not present

## 2016-11-08 DIAGNOSIS — E6609 Other obesity due to excess calories: Secondary | ICD-10-CM | POA: Diagnosis not present

## 2016-11-08 DIAGNOSIS — R945 Abnormal results of liver function studies: Secondary | ICD-10-CM | POA: Diagnosis not present

## 2016-11-08 DIAGNOSIS — Z6837 Body mass index (BMI) 37.0-37.9, adult: Secondary | ICD-10-CM | POA: Diagnosis not present

## 2016-11-08 DIAGNOSIS — I10 Essential (primary) hypertension: Secondary | ICD-10-CM | POA: Diagnosis not present

## 2016-11-08 DIAGNOSIS — R011 Cardiac murmur, unspecified: Secondary | ICD-10-CM | POA: Diagnosis not present

## 2016-11-08 DIAGNOSIS — M5430 Sciatica, unspecified side: Secondary | ICD-10-CM | POA: Diagnosis not present

## 2017-03-10 DIAGNOSIS — I10 Essential (primary) hypertension: Secondary | ICD-10-CM | POA: Diagnosis not present

## 2017-03-10 DIAGNOSIS — R945 Abnormal results of liver function studies: Secondary | ICD-10-CM | POA: Diagnosis not present

## 2017-03-10 DIAGNOSIS — E782 Mixed hyperlipidemia: Secondary | ICD-10-CM | POA: Diagnosis not present

## 2017-03-10 DIAGNOSIS — E1165 Type 2 diabetes mellitus with hyperglycemia: Secondary | ICD-10-CM | POA: Diagnosis not present

## 2017-03-12 DIAGNOSIS — E782 Mixed hyperlipidemia: Secondary | ICD-10-CM | POA: Diagnosis not present

## 2017-03-12 DIAGNOSIS — E6609 Other obesity due to excess calories: Secondary | ICD-10-CM | POA: Diagnosis not present

## 2017-03-12 DIAGNOSIS — I872 Venous insufficiency (chronic) (peripheral): Secondary | ICD-10-CM | POA: Diagnosis not present

## 2017-03-12 DIAGNOSIS — R011 Cardiac murmur, unspecified: Secondary | ICD-10-CM | POA: Diagnosis not present

## 2017-03-12 DIAGNOSIS — M5430 Sciatica, unspecified side: Secondary | ICD-10-CM | POA: Diagnosis not present

## 2017-03-12 DIAGNOSIS — I10 Essential (primary) hypertension: Secondary | ICD-10-CM | POA: Diagnosis not present

## 2017-03-12 DIAGNOSIS — E1165 Type 2 diabetes mellitus with hyperglycemia: Secondary | ICD-10-CM | POA: Diagnosis not present

## 2017-03-12 DIAGNOSIS — R945 Abnormal results of liver function studies: Secondary | ICD-10-CM | POA: Diagnosis not present

## 2017-04-09 DIAGNOSIS — Z9849 Cataract extraction status, unspecified eye: Secondary | ICD-10-CM | POA: Diagnosis not present

## 2017-04-09 DIAGNOSIS — E119 Type 2 diabetes mellitus without complications: Secondary | ICD-10-CM | POA: Diagnosis not present

## 2017-04-09 DIAGNOSIS — Z961 Presence of intraocular lens: Secondary | ICD-10-CM | POA: Diagnosis not present

## 2017-06-18 DIAGNOSIS — E782 Mixed hyperlipidemia: Secondary | ICD-10-CM | POA: Diagnosis not present

## 2017-06-18 DIAGNOSIS — I10 Essential (primary) hypertension: Secondary | ICD-10-CM | POA: Diagnosis not present

## 2017-06-20 DIAGNOSIS — E6609 Other obesity due to excess calories: Secondary | ICD-10-CM | POA: Diagnosis not present

## 2017-06-20 DIAGNOSIS — R011 Cardiac murmur, unspecified: Secondary | ICD-10-CM | POA: Diagnosis not present

## 2017-06-20 DIAGNOSIS — M545 Low back pain: Secondary | ICD-10-CM | POA: Diagnosis not present

## 2017-06-20 DIAGNOSIS — I10 Essential (primary) hypertension: Secondary | ICD-10-CM | POA: Diagnosis not present

## 2017-06-20 DIAGNOSIS — M5430 Sciatica, unspecified side: Secondary | ICD-10-CM | POA: Diagnosis not present

## 2017-06-20 DIAGNOSIS — E782 Mixed hyperlipidemia: Secondary | ICD-10-CM | POA: Diagnosis not present

## 2017-06-20 DIAGNOSIS — L720 Epidermal cyst: Secondary | ICD-10-CM | POA: Diagnosis not present

## 2017-06-20 DIAGNOSIS — M25551 Pain in right hip: Secondary | ICD-10-CM | POA: Diagnosis not present

## 2017-06-20 DIAGNOSIS — E119 Type 2 diabetes mellitus without complications: Secondary | ICD-10-CM | POA: Diagnosis not present

## 2017-12-19 DIAGNOSIS — R011 Cardiac murmur, unspecified: Secondary | ICD-10-CM | POA: Diagnosis not present

## 2017-12-19 DIAGNOSIS — M25551 Pain in right hip: Secondary | ICD-10-CM | POA: Diagnosis not present

## 2017-12-19 DIAGNOSIS — E119 Type 2 diabetes mellitus without complications: Secondary | ICD-10-CM | POA: Diagnosis not present

## 2017-12-19 DIAGNOSIS — E6609 Other obesity due to excess calories: Secondary | ICD-10-CM | POA: Diagnosis not present

## 2017-12-19 DIAGNOSIS — M5430 Sciatica, unspecified side: Secondary | ICD-10-CM | POA: Diagnosis not present

## 2017-12-19 DIAGNOSIS — I872 Venous insufficiency (chronic) (peripheral): Secondary | ICD-10-CM | POA: Diagnosis not present

## 2017-12-19 DIAGNOSIS — Z6836 Body mass index (BMI) 36.0-36.9, adult: Secondary | ICD-10-CM | POA: Diagnosis not present

## 2017-12-19 DIAGNOSIS — L720 Epidermal cyst: Secondary | ICD-10-CM | POA: Diagnosis not present

## 2017-12-19 DIAGNOSIS — M545 Low back pain: Secondary | ICD-10-CM | POA: Diagnosis not present

## 2017-12-23 DIAGNOSIS — E6609 Other obesity due to excess calories: Secondary | ICD-10-CM | POA: Diagnosis not present

## 2017-12-23 DIAGNOSIS — M5430 Sciatica, unspecified side: Secondary | ICD-10-CM | POA: Diagnosis not present

## 2017-12-23 DIAGNOSIS — M25551 Pain in right hip: Secondary | ICD-10-CM | POA: Diagnosis not present

## 2017-12-23 DIAGNOSIS — Z23 Encounter for immunization: Secondary | ICD-10-CM | POA: Diagnosis not present

## 2017-12-23 DIAGNOSIS — R945 Abnormal results of liver function studies: Secondary | ICD-10-CM | POA: Diagnosis not present

## 2017-12-23 DIAGNOSIS — E782 Mixed hyperlipidemia: Secondary | ICD-10-CM | POA: Diagnosis not present

## 2017-12-23 DIAGNOSIS — E1169 Type 2 diabetes mellitus with other specified complication: Secondary | ICD-10-CM | POA: Diagnosis not present

## 2017-12-23 DIAGNOSIS — I10 Essential (primary) hypertension: Secondary | ICD-10-CM | POA: Diagnosis not present

## 2017-12-23 DIAGNOSIS — M545 Low back pain: Secondary | ICD-10-CM | POA: Diagnosis not present

## 2018-04-08 DIAGNOSIS — E1165 Type 2 diabetes mellitus with hyperglycemia: Secondary | ICD-10-CM | POA: Diagnosis not present

## 2018-04-08 DIAGNOSIS — E782 Mixed hyperlipidemia: Secondary | ICD-10-CM | POA: Diagnosis not present

## 2018-04-08 DIAGNOSIS — I10 Essential (primary) hypertension: Secondary | ICD-10-CM | POA: Diagnosis not present

## 2018-04-08 DIAGNOSIS — E119 Type 2 diabetes mellitus without complications: Secondary | ICD-10-CM | POA: Diagnosis not present

## 2018-04-14 DIAGNOSIS — R011 Cardiac murmur, unspecified: Secondary | ICD-10-CM | POA: Diagnosis not present

## 2018-04-14 DIAGNOSIS — E6609 Other obesity due to excess calories: Secondary | ICD-10-CM | POA: Diagnosis not present

## 2018-04-14 DIAGNOSIS — R945 Abnormal results of liver function studies: Secondary | ICD-10-CM | POA: Diagnosis not present

## 2018-04-14 DIAGNOSIS — E1169 Type 2 diabetes mellitus with other specified complication: Secondary | ICD-10-CM | POA: Diagnosis not present

## 2018-04-14 DIAGNOSIS — I10 Essential (primary) hypertension: Secondary | ICD-10-CM | POA: Diagnosis not present

## 2018-04-14 DIAGNOSIS — M5430 Sciatica, unspecified side: Secondary | ICD-10-CM | POA: Diagnosis not present

## 2018-04-14 DIAGNOSIS — M545 Low back pain: Secondary | ICD-10-CM | POA: Diagnosis not present

## 2018-04-14 DIAGNOSIS — E782 Mixed hyperlipidemia: Secondary | ICD-10-CM | POA: Diagnosis not present

## 2018-04-14 DIAGNOSIS — M25551 Pain in right hip: Secondary | ICD-10-CM | POA: Diagnosis not present

## 2018-06-05 DIAGNOSIS — Z0000 Encounter for general adult medical examination without abnormal findings: Secondary | ICD-10-CM | POA: Diagnosis not present

## 2018-07-15 DIAGNOSIS — E1165 Type 2 diabetes mellitus with hyperglycemia: Secondary | ICD-10-CM | POA: Diagnosis not present

## 2018-07-15 DIAGNOSIS — I10 Essential (primary) hypertension: Secondary | ICD-10-CM | POA: Diagnosis not present

## 2018-07-15 DIAGNOSIS — E1169 Type 2 diabetes mellitus with other specified complication: Secondary | ICD-10-CM | POA: Diagnosis not present

## 2018-07-15 DIAGNOSIS — R945 Abnormal results of liver function studies: Secondary | ICD-10-CM | POA: Diagnosis not present

## 2018-07-15 DIAGNOSIS — E782 Mixed hyperlipidemia: Secondary | ICD-10-CM | POA: Diagnosis not present

## 2018-07-21 ENCOUNTER — Other Ambulatory Visit: Payer: Self-pay | Admitting: Internal Medicine

## 2018-07-21 DIAGNOSIS — M545 Low back pain, unspecified: Secondary | ICD-10-CM

## 2018-07-21 DIAGNOSIS — M542 Cervicalgia: Secondary | ICD-10-CM | POA: Diagnosis not present

## 2018-07-21 DIAGNOSIS — E1169 Type 2 diabetes mellitus with other specified complication: Secondary | ICD-10-CM | POA: Diagnosis not present

## 2018-07-21 DIAGNOSIS — E782 Mixed hyperlipidemia: Secondary | ICD-10-CM | POA: Diagnosis not present

## 2018-07-21 DIAGNOSIS — R945 Abnormal results of liver function studies: Secondary | ICD-10-CM | POA: Diagnosis not present

## 2018-07-21 DIAGNOSIS — G8929 Other chronic pain: Secondary | ICD-10-CM

## 2018-07-21 DIAGNOSIS — R011 Cardiac murmur, unspecified: Secondary | ICD-10-CM | POA: Diagnosis not present

## 2018-07-21 DIAGNOSIS — M5430 Sciatica, unspecified side: Secondary | ICD-10-CM | POA: Diagnosis not present

## 2018-07-21 DIAGNOSIS — I10 Essential (primary) hypertension: Secondary | ICD-10-CM | POA: Diagnosis not present

## 2018-08-18 ENCOUNTER — Ambulatory Visit (HOSPITAL_COMMUNITY)
Admission: RE | Admit: 2018-08-18 | Discharge: 2018-08-18 | Disposition: A | Discharge status: Home / Self Care | Payer: Medicare HMO | Source: Ambulatory Visit | Attending: Internal Medicine | Admitting: Internal Medicine

## 2018-08-18 ENCOUNTER — Other Ambulatory Visit: Payer: Self-pay

## 2018-08-18 DIAGNOSIS — M545 Low back pain, unspecified: Secondary | ICD-10-CM

## 2018-08-18 DIAGNOSIS — G8929 Other chronic pain: Secondary | ICD-10-CM | POA: Diagnosis not present

## 2018-08-18 DIAGNOSIS — M542 Cervicalgia: Secondary | ICD-10-CM | POA: Diagnosis not present

## 2018-08-18 DIAGNOSIS — M5030 Other cervical disc degeneration, unspecified cervical region: Secondary | ICD-10-CM | POA: Diagnosis not present

## 2018-08-18 DIAGNOSIS — M4807 Spinal stenosis, lumbosacral region: Secondary | ICD-10-CM | POA: Diagnosis not present

## 2018-08-18 DIAGNOSIS — M5136 Other intervertebral disc degeneration, lumbar region: Secondary | ICD-10-CM | POA: Diagnosis not present

## 2018-08-18 DIAGNOSIS — M4802 Spinal stenosis, cervical region: Secondary | ICD-10-CM | POA: Diagnosis not present

## 2018-10-09 DIAGNOSIS — L723 Sebaceous cyst: Secondary | ICD-10-CM | POA: Diagnosis not present

## 2018-10-20 ENCOUNTER — Ambulatory Visit (INDEPENDENT_AMBULATORY_CARE_PROVIDER_SITE_OTHER): Payer: Medicare HMO | Admitting: General Surgery

## 2018-10-20 ENCOUNTER — Encounter: Payer: Self-pay | Admitting: General Surgery

## 2018-10-20 ENCOUNTER — Ambulatory Visit: Payer: Medicare HMO | Admitting: General Surgery

## 2018-10-20 ENCOUNTER — Other Ambulatory Visit: Payer: Self-pay

## 2018-10-20 VITALS — BP 129/72 | HR 77 | Temp 97.8°F | Resp 16 | Ht 74.0 in | Wt 281.0 lb

## 2018-10-20 DIAGNOSIS — L723 Sebaceous cyst: Secondary | ICD-10-CM

## 2018-10-20 DIAGNOSIS — L089 Local infection of the skin and subcutaneous tissue, unspecified: Secondary | ICD-10-CM

## 2018-10-20 MED ORDER — SULFAMETHOXAZOLE-TRIMETHOPRIM 800-160 MG PO TABS: 1.0000 | ORAL_TABLET | Freq: Two times a day (BID) | ORAL | 0 refills | Status: AC

## 2018-10-20 NOTE — Progress Notes (Signed)
[facility] History and Physical  Reason for Referral: Sebaceous cyst back  Referring Physician: Benita StabileHall, John Z, MD  Chief Complaint    Abscess      Jerome Gonzalez is a 78 y.o. male.  HPI: Jerome Gonzalez is a [id number] yo who has DM, HTN, HLD and reports having an area on his back that swellings and drains periodically. Jerome Gonzalez says it has been occurring over the last several months, and that Jerome Gonzalez was seen by his PCP about this at the end of August. Jerome Gonzalez has never had an aspiration, drainage or antibiotics for the area prior.  Jerome Gonzalez says that this all started happening < 1 year ago. Jerome Gonzalez has had other cysts from his back in the past. Jerome Gonzalez says that the area has drained some, and the pressure and tightness has improved. Jerome Gonzalez denies any fevers or chills.   Past Medical History:  Diagnosis Date  . Cholelithiasis    Asymptomatic  . COPD (chronic obstructive pulmonary disease) (HCC)    Noted on chest CT done to f/u ? pulm nodule noted on CXR-(mild emphysema + asymptomatic gallstone: no pulm nodule.)  . Diabetes mellitus   . DOE (dyspnea on exertion) 11/2010   PFTs and EKG normal at prior PMD's office.  . Erectile dysfunction   . History of cluster headache 2012   Percocet aborted them  . Hypercholesterolemia   . Hypertension   . Insomnia    Nocturnal oxygen monitoring NORMAL 2012  . Rectal bleeding 04/2011   TCS planned but then decided on anusol supp and this helped significantly: GI agreed w/pt that it was ok to wait until 12/2012 to do his "regular" 10 yr recall for screening TCS.    Past Surgical History:  Procedure Laterality Date  . COLONOSCOPY  Nov 2004   RMR: friable anal canal, left-sided diverticula.  Repeat with [facility] (Dr. Kendell Baneourke) is planned for 2014  . foot surgeries     X 3, as child    Family History  Problem Relation Age of Onset  . Colon cancer Neg Hx     Social History   Tobacco Use  . Smoking status: Former Smoker    Packs/day: 1.00    Types: Cigarettes   . Smokeless tobacco: Former NeurosurgeonUser    Quit date: 03/01/1983  Substance Use Topics  . Alcohol use: No  . Drug use: No    Medications: I have reviewed the patient's current medications. Allergies as of 10/20/2018   No Known Allergies     Medication List       Accurate as of October 20, 2018 11:24 AM. If you have any questions, ask your nurse or doctor.        aspirin 81 MG tablet Take 81 mg by mouth daily.   atorvastatin 20 MG tablet Commonly known as: LIPITOR Take 1 tablet (20 mg total) by mouth daily.   fish oil-omega-3 fatty acids 1000 MG capsule Take 2 g by mouth daily.   fluticasone 50 MCG/ACT nasal spray Commonly known as: FLONASE Place 2 sprays into the nose daily.   HYDROcodone-acetaminophen 10-325 MG tablet Commonly known as: NORCO TAKE 1 2 TO 1 (ONE HALF TO ONE) TABLET BY MOUTH EVERY 8 HOURS AS NEEDED FOR PAIN   lisinopril 20 MG tablet Commonly known as: ZESTRIL Take 20 mg by mouth daily.   metFORMIN 500 MG tablet Commonly known as: GLUCOPHAGE Take 500 mg by mouth 2 (two) times daily with a meal.   metoprolol  succinate 25 MG 24 hr tablet Commonly known as: TOPROL-XL 1 tab po qAM   multivitamin capsule Take 1 capsule by mouth daily.   nabumetone 750 MG tablet Commonly known as: RELAFEN Take 750 mg by mouth daily.   oxyCODONE-acetaminophen 5-325 MG tablet Commonly known as: PERCOCET/ROXICET   rosuvastatin 10 MG tablet Commonly known as: CRESTOR   sulfamethoxazole-trimethoprim 800-160 MG tablet Commonly known as: BACTRIM DS Take 1 tablet by mouth 2 (two) times daily for 7 days. Started by: Lucretia Roers, MD        ROS:  A comprehensive review of systems was negative except for: Musculoskeletal: positive for back pain, neck pain, stiff joints and drain cyst on back Endocrine: positive for diabetes  Blood pressure 129/72, pulse 77, temperature 97.8 F (36.6 C), temperature source Tympanic, resp. rate 16, height 6\' 2"  (1.88 m), weight 281  lb (127.5 kg), SpO2 94 %. Physical Exam Vitals signs reviewed.  Constitutional:      Appearance: Normal appearance.  HENT:     Head: Normocephalic and atraumatic.     Nose: Nose normal.     Mouth/Throat:     Mouth: Mucous membranes are moist.  Eyes:     Extraocular Movements: Extraocular movements intact.     Pupils: Pupils are equal, round, and reactive to light.  Neck:     Musculoskeletal: Normal range of motion and neck supple.  Cardiovascular:     Rate and Rhythm: Normal rate and regular rhythm.  Pulmonary:     Effort: Pulmonary effort is normal.     Breath sounds: Normal breath sounds.  Chest:     Comments: Mid lower back with 4cm swollen area with fluctuance, minor erythema overlying the area, punctate central area with drainage, tender; area cleaned with prep and aspiration of 5cc of brown purulent fluid Abdominal:     General: There is no distension.     Palpations: Abdomen is soft.     Tenderness: There is no abdominal tenderness.  Musculoskeletal: Normal range of motion.        General: No swelling.  Skin:    General: Skin is warm and dry.  Neurological:     General: No focal deficit present.     Mental Status: Jerome Gonzalez is alert and oriented to person, place, and time.  Psychiatric:        Mood and Affect: Mood normal.        Behavior: Behavior normal.        Thought Content: Thought content normal.        Judgment: Judgment normal.     Results: None  Procedures: Aspiration of infected sebaceous cyst  Description: Back cleaned with iodine overlying the area. 18 gauge needle and syringe placed into the central area of the 4cm lesion with fluctuance, aspirated 5cc of brown purulent material, patient tolerated well without issues. Band-Aid applied.   Assessment & Plan:  Jerome Gonzalez is a 78 y.o. male with an infected sebaceous cyst with active erythema and drainage. Jerome Gonzalez is having some tenderness and pain, so this was aspirated to give him some relief currently.  Jerome Gonzalez has  never received antibiotics for this cyst.    -Discussed role of surgery to remove the cyst, risk of bleeding, infection, recurrence, and need for sedation due to the size and need for potential extensive dissection. Discussed possible need for packing after excision due to the active infection.  -The patient wants to see what his insurance will cover. We have aspirated the  area today and this should help calm it down. -Bactrim DS BID sent to pharmacy  -Will notify patient after speaking with his insurance   All questions were answered to the satisfaction of the patient.   Virl Cagey 10/20/2018, 11:24 AM

## 2018-10-20 NOTE — Patient Instructions (Signed)
Epidermal (Sebaceous) Cyst  An epidermal cyst is a sac made of skin tissue. The sac contains a substance called keratin. Keratin is a protein that is normally secreted through the hair follicles. When keratin becomes trapped in the top layer of skin (epidermis), it can form an epidermal cyst. Epidermal cysts can be found anywhere on your body. These cysts are usually harmless (benign), and they may not cause symptoms unless they become infected. What are the causes? This condition may be caused by:  A blocked hair follicle.  A hair that curls and re-enters the skin instead of growing straight out of the skin (ingrown hair).  A blocked pore.  Irritated skin.  An injury to the skin.  Certain conditions that are passed along from parent to child (inherited).  Human papillomavirus (HPV).  Long-term (chronic) sun damage to the skin. What increases the risk? The following factors may make you more likely to develop an epidermal cyst:  Having acne.  Being overweight.  Being 57-95 years old. What are the signs or symptoms? The only symptom of this condition may be a small, painless lump underneath the skin. When an epidermal cyst ruptures, it may become infected. Symptoms may include:  Redness.  Inflammation.  Tenderness.  Warmth.  Fever.  Keratin draining from the cyst. Keratin is grayish-white, bad-smelling substance.  Pus draining from the cyst. How is this diagnosed? This condition is diagnosed with a physical exam.  In some cases, you may have a sample of tissue (biopsy) taken from your cyst to be examined under a microscope or tested for bacteria.  You may be referred to a health care provider who specializes in skin care (dermatologist). How is this treated? In many cases, epidermal cysts go away on their own without treatment. If a cyst becomes infected, treatment may include:  Opening and draining the cyst, done by a health care provider. After draining, minor  surgery to remove the rest of the cyst may be done.  Antibiotic medicine.  Injections of medicines (steroids) that help to reduce inflammation.  Surgery to remove the cyst. Surgery may be done if the cyst: ? Becomes large. ? Bothers you. ? Has a chance of turning into cancer.  Do not try to open a cyst yourself. Follow these instructions at home:  Take over-the-counter and prescription medicines only as told by your health care provider.  If you were prescribed an antibiotic medicine, take it it as told by your health care provider. Do not stop using the antibiotic even if you start to feel better.  Keep the area around your cyst clean and dry.  Wear loose, dry clothing.  Avoid touching your cyst.  Check your cyst every day for signs of infection. Check for: ? Redness, swelling, or pain. ? Fluid or blood. ? Warmth. ? Pus or a bad smell.  Keep all follow-up visits as told by your health care provider. This is important. How is this prevented?  Wear clean, dry, clothing.  Avoid wearing tight clothing.  Keep your skin clean and dry. Take showers or baths every day. Contact a health care provider if:  Your cyst develops symptoms of infection.  Your condition is not improving or is getting worse.  You develop a cyst that looks different from other cysts you have had.  You have a fever. Get help right away if:  Redness spreads from the cyst into the surrounding area. Summary  An epidermal cyst is a sac made of skin tissue. These cysts  are usually harmless (benign), and they may not cause symptoms unless they become infected.  If a cyst becomes infected, treatment may include surgery to open and drain the cyst, or to remove it. Treatment may also include medicines by mouth or through an injection.  Take over-the-counter and prescription medicines only as told by your health care provider. If you were prescribed an antibiotic medicine, take it as told by your health  care provider. Do not stop using the antibiotic even if you start to feel better.  Contact a health care provider if your condition is not improving or is getting worse.  Keep all follow-up visits as told by your health care provider. This is important. This information is not intended to replace advice given to you by your health care provider. Make sure you discuss any questions you have with your health care provider. Document Released: 12/30/2003 Document Revised: 05/21/2018 Document Reviewed: 08/11/2017 Elsevier Patient Education  [place].   Epidermal (Sebaceous) Cyst Removal  Epidermal cyst removal is a procedure to remove a sac of oily material (keratin) that has formed under your skin (epidermal cyst). Epidermal cysts may also be called epidermoid cysts, sebaceous cysts, or keratin cysts. Normally, the skin secretes this oily material through a gland or a hair follicle. However, when a skin gland or hair follicle becomes blocked, an epidermal cyst can form. You may need this procedure if you have an epidermal cyst that becomes large, uncomfortable, or infected. Tell a health care provider about:  Any allergies you have.  All medicines you are taking, including vitamins, herbs, eye drops, creams, and over-the-counter medicines.  Any problems you or family members have had with anesthetic medicines.  Any blood disorders you have.  Any surgeries you have had.  Any medical conditions you have now or have had.  Whether you are pregnant or may be pregnant. What are the risks? Generally, this is a safe procedure. However, problems may occur, including:  Development of another cyst.  Bleeding.  Infection.  Scarring. What happens before the procedure?  Ask your health care provider about: ? Changing or stopping your regular medicines. This is especially important if you are taking diabetes medicines or blood thinners. ? Taking medicines such as aspirin and  ibuprofen. These medicines can thin your blood. Do not take these medicines unless your health care provider tells you to take them. ? Taking over-the-counter medicines, vitamins, herbs, and supplements.  If you have an infected cyst, you may have to take antibiotic medicine before the cyst removal. Take your antibiotic as told by your health care provider. Do not stop taking the antibiotic even if you start to feel better.  Take a shower on the morning of your procedure. Your health care provider may ask you to use a germ-killing soap. What happens during the procedure?   You will be given a medicine to numb the area (local anesthetic).  The skin around the cyst will be cleaned with a germ-killing solution.  The health care provider will make a small incision in your skin over the cyst.  The health care provider will separate the cyst from the surrounding tissues that are under your skin.  If possible, the cyst will be removed undamaged (intact).  If the cyst bursts (ruptures), it will be removed in pieces.  After the cyst is removed, the health care provider will control any bleeding and close the incision with small stitches (sutures). Small incisions may not need sutures, and the bleeding  will be controlled by applying direct pressure with gauze.  The health care provider may apply antibiotic ointment and a bandage (dressing) over the incision. The procedure may vary among health care providers and hospitals. What happens after the procedure?  If your cyst ruptured during the procedure, you may need an antibiotic. If you are prescribed an antibiotic medicine or ointment, take or apply it as told by your health care provider. Do not stop using the antibiotic even if you start to feel better. Summary  Epidermal cyst removal is a procedure to remove a sac of oily material (keratin) that has formed under your skin (epidermal cyst).  You may need this procedure if you have an epidermal  cyst that becomes large, uncomfortable, or infected.  The health care provider will make a small incision in your skin to remove the cyst.  If you are prescribed an antibiotic medicine before the procedure, after the procedure, or both, use the antibiotic as told by your health care provider. Do not stop using the antibiotic even if you start to feel better. This information is not intended to replace advice given to you by your health care provider. Make sure you discuss any questions you have with your health care provider. Document Released: 01/26/2000 Document Revised: 05/21/2018 Document Reviewed: 11/21/2016 Elsevier Patient Education  2020 [employer]

## 2018-10-28 DIAGNOSIS — E1165 Type 2 diabetes mellitus with hyperglycemia: Secondary | ICD-10-CM | POA: Diagnosis not present

## 2018-10-28 DIAGNOSIS — E1169 Type 2 diabetes mellitus with other specified complication: Secondary | ICD-10-CM | POA: Diagnosis not present

## 2018-10-28 DIAGNOSIS — I10 Essential (primary) hypertension: Secondary | ICD-10-CM | POA: Diagnosis not present

## 2018-10-28 DIAGNOSIS — E782 Mixed hyperlipidemia: Secondary | ICD-10-CM | POA: Diagnosis not present

## 2018-11-04 DIAGNOSIS — R945 Abnormal results of liver function studies: Secondary | ICD-10-CM | POA: Diagnosis not present

## 2018-11-04 DIAGNOSIS — M5430 Sciatica, unspecified side: Secondary | ICD-10-CM | POA: Diagnosis not present

## 2018-11-04 DIAGNOSIS — R011 Cardiac murmur, unspecified: Secondary | ICD-10-CM | POA: Diagnosis not present

## 2018-11-04 DIAGNOSIS — I10 Essential (primary) hypertension: Secondary | ICD-10-CM | POA: Diagnosis not present

## 2018-11-04 DIAGNOSIS — E782 Mixed hyperlipidemia: Secondary | ICD-10-CM | POA: Diagnosis not present

## 2018-11-04 DIAGNOSIS — E1169 Type 2 diabetes mellitus with other specified complication: Secondary | ICD-10-CM | POA: Diagnosis not present

## 2018-11-04 DIAGNOSIS — M545 Low back pain: Secondary | ICD-10-CM | POA: Diagnosis not present

## 2018-11-04 DIAGNOSIS — M542 Cervicalgia: Secondary | ICD-10-CM | POA: Diagnosis not present

## 2018-11-19 NOTE — Patient Instructions (Signed)
Jerome MillardLarry E Gonzalez  11/19/2018     @PREFPERIOPPHARMACY @   Your procedure is scheduled on  11/25/2018.  [facility] at  [phone]  A.M.  Call this number if you have problems the morning of surgery:  [phone]   Remember:  Do not eat or drink after midnight.                         Take these medicines the morning of surgery with A SIP OF WATER amlodipine, hydrocodone(if needed), avapro, metoprolol.    Do not wear jewelry, make-up or nail polish.  Do not wear lotions, powders, or perfumes. Please wear deodorant and brush your teeth.  Do not shave 48 hours prior to surgery.  Men may shave face and neck.  Do not bring valuables to the hospital.  [facility] is not responsible for any belongings or valuables.  Contacts, dentures or bridgework may not be worn into surgery.  Leave your suitcase in the car.  After surgery it may be brought to your room.  For patients admitted to the hospital, discharge time will be determined by your treatment team.  Patients discharged the day of surgery will not be allowed to drive home.   Name and phone number of your driver:   family Special instructions:  None  Please read over the following fact sheets that you were given. Anesthesia Post-op Instructions and Care and Recovery After Surgery       Epidermal Cyst Removal, Care After This sheet gives you information about how to care for yourself after your procedure. Your health care provider may also give you more specific instructions. If you have problems or questions, contact your health care provider. What can I expect after the procedure? After the procedure, it is common to have:  Soreness in the area where your cyst was removed.  Tightness or itchiness from the stitches (sutures) in your skin. Follow these instructions at home: Medicines  Take over-the-counter and prescription medicines only as told by your health care provider.  If you were prescribed an  antibiotic medicine or ointment, take or apply it as told by your health care provider. Do not stop using the antibiotic even if you start to feel better. Incision care   Follow instructions from your health care provider about how to take care of your incision. Make sure you: ? Wash your hands with soap and water before you change your bandage (dressing). If soap and water are not available, use hand sanitizer. ? Change your dressing as told by your health care provider. ? Leave sutures, skin glue, or adhesive strips in place. These skin closures may need to stay in place for 1-2 weeks or longer. If adhesive strip edges start to loosen and curl up, you may trim the loose edges. Do not remove adhesive strips completely unless your health care provider tells you to do that.  Keep the dressingdry until your health care provider says that it can be removed.  After your dressing is off, check your incision area every day for signs of infection. Check for: ? Redness, swelling, or pain. ? Fluid or blood. ? Warmth. ? Pus or a bad smell. General instructions  Do not take baths, swim, or use a hot tub until your health care provider approves. Ask your health care provider if you may take showers. You may only be allowed to take sponge baths.  Your  health care provider may ask you to avoid contact sports or activities that take a lot of effort. Do not do anything that stretches or puts pressure on your incision.  You can return to your normal diet.  Keep all follow-up visits as told by your health care provider. This is important. Contact a health care provider if:  You have a fever.  You have redness, swelling, or pain in the incision area.  You have fluid or blood coming from your incision.  You have pus or a bad smell coming from your incision.  Your incision feels warm to the touch.  Your cyst grows back. Summary  After the procedure, it is common to have soreness in the area  where your cyst was removed.  Take or apply over-the-counter and prescription medicines only as told by your health care provider.  Follow instructions from your health care provider about how to take care of your incision. This information is not intended to replace advice given to you by your health care provider. Make sure you discuss any questions you have with your health care provider. Document Released: 02/18/2014 Document Revised: 05/20/2017 Document Reviewed: 11/21/2016 Elsevier Patient Education  2020 [place] After These instructions provide you with information about caring for yourself after your procedure. Your health care provider may also give you more specific instructions. Your treatment has been planned according to current medical practices, but problems sometimes occur. Call your health care provider if you have any problems or questions after your procedure. What can I expect after the procedure? After your procedure, you may:  Feel sleepy for several hours.  Feel clumsy and have poor balance for several hours.  Feel forgetful about what happened after the procedure.  Have poor judgment for several hours.  Feel nauseous or vomit.  Have a sore throat if you had a breathing tube during the procedure. Follow these instructions at home: For at least 24 hours after the procedure:      Have a responsible adult stay with you. It is important to have someone help care for you until you are awake and alert.  Rest as needed.  Do not: ? Participate in activities in which you could fall or become injured. ? Drive. ? Use heavy machinery. ? Drink alcohol. ? Take sleeping pills or medicines that cause drowsiness. ? Make important decisions or sign legal documents. ? Take care of children on your own. Eating and drinking  Follow the diet that is recommended by your health care provider.  If you vomit, drink water, juice, or  soup when you can drink without vomiting.  Make sure you have little or no nausea before eating solid foods. General instructions  Take over-the-counter and prescription medicines only as told by your health care provider.  If you have sleep apnea, surgery and certain medicines can increase your risk for breathing problems. Follow instructions from your health care provider about wearing your sleep device: ? Anytime you are sleeping, including during daytime naps. ? While taking prescription pain medicines, sleeping medicines, or medicines that make you drowsy.  If you smoke, do not smoke without supervision.  Keep all follow-up visits as told by your health care provider. This is important. Contact a health care provider if:  You keep feeling nauseous or you keep vomiting.  You feel light-headed.  You develop a rash.  You have a fever. Get help right away if:  You have trouble breathing. Summary  For several hours after your procedure, you may feel sleepy and have poor judgment.  Have a responsible adult stay with you for at least 24 hours or until you are awake and alert. This information is not intended to replace advice given to you by your health care provider. Make sure you discuss any questions you have with your health care provider. Document Released: 05/21/2015 Document Revised: 04/28/2017 Document Reviewed: 05/21/2015 Elsevier Patient Education  2020 [employer]. How to Use Chlorhexidine for Bathing Chlorhexidine gluconate (CHG) is a germ-killing (antiseptic) solution that is used to clean the skin. It can get rid of the bacteria that normally live on the skin and can keep them away for about 24 hours. To clean your skin with CHG, you may be given:  A CHG solution to use in the shower or as part of a sponge bath.  A prepackaged cloth that contains CHG. Cleaning your skin with CHG may help lower the risk for infection:  While you are staying in the intensive care  unit of the hospital.  If you have a vascular access, such as a central line, to provide short-term or long-term access to your veins.  If you have a catheter to drain urine from your bladder.  If you are on a ventilator. A ventilator is a machine that helps you breathe by moving air in and out of your lungs.  After surgery. What are the risks? Risks of using CHG include:  A skin reaction.  Hearing loss, if CHG gets in your ears.  Eye injury, if CHG gets in your eyes and is not rinsed out.  The CHG product catching fire. Make sure that you avoid smoking and flames after applying CHG to your skin. Do not use CHG:  If you have a chlorhexidine allergy or have previously reacted to chlorhexidine.  On babies younger than 81 months of age. How to use CHG solution  Use CHG only as told by your health care provider, and follow the instructions on the label.  Use the full amount of CHG as directed. Usually, this is one bottle. During a shower Follow these steps when using CHG solution during a shower (unless your health care provider gives you different instructions): 1. Start the shower. 2. Use your normal soap and shampoo to wash your face and hair. 3. Turn off the shower or move out of the shower stream. 4. Pour the CHG onto a clean washcloth. Do not use any type of brush or rough-edged sponge. 5. Starting at your neck, lather your body down to your toes. Make sure you follow these instructions: ? If you will be having surgery, pay special attention to the part of your body where you will be having surgery. Scrub this area for at least 1 minute. ? Do not use CHG on your head or face. If the solution gets into your ears or eyes, rinse them well with water. ? Avoid your genital area. ? Avoid any areas of skin that have broken skin, cuts, or scrapes. ? Scrub your back and under your arms. Make sure to wash skin folds. 6. Let the lather sit on your skin for 1-2 minutes or as long as  told by your health care provider. 7. Thoroughly rinse your entire body in the shower. Make sure that all body creases and crevices are rinsed well. 8. Dry off with a clean towel. Do not put any substances on your body afterward-such as powder, lotion, or perfume-unless you are told  to do so by your health care provider. Only use lotions that are recommended by the manufacturer. 9. Put on clean clothes or pajamas. 10. If it is the night before your surgery, sleep in clean sheets.  During a sponge bath Follow these steps when using CHG solution during a sponge bath (unless your health care provider gives you different instructions): 1. Use your normal soap and shampoo to wash your face and hair. 2. Pour the CHG onto a clean washcloth. 3. Starting at your neck, lather your body down to your toes. Make sure you follow these instructions: ? If you will be having surgery, pay special attention to the part of your body where you will be having surgery. Scrub this area for at least 1 minute. ? Do not use CHG on your head or face. If the solution gets into your ears or eyes, rinse them well with water. ? Avoid your genital area. ? Avoid any areas of skin that have broken skin, cuts, or scrapes. ? Scrub your back and under your arms. Make sure to wash skin folds. 4. Let the lather sit on your skin for 1-2 minutes or as long as told by your health care provider. 5. Using a different clean, wet washcloth, thoroughly rinse your entire body. Make sure that all body creases and crevices are rinsed well. 6. Dry off with a clean towel. Do not put any substances on your body afterward-such as powder, lotion, or perfume-unless you are told to do so by your health care provider. Only use lotions that are recommended by the manufacturer. 7. Put on clean clothes or pajamas. 8. If it is the night before your surgery, sleep in clean sheets. How to use CHG prepackaged cloths  Only use CHG cloths as told by your  health care provider, and follow the instructions on the label.  Use the CHG cloth on clean, dry skin.  Do not use the CHG cloth on your head or face unless your health care provider tells you to.  When washing with the CHG cloth: ? Avoid your genital area. ? Avoid any areas of skin that have broken skin, cuts, or scrapes. Before surgery Follow these steps when using a CHG cloth to clean before surgery (unless your health care provider gives you different instructions): 1. Using the CHG cloth, vigorously scrub the part of your body where you will be having surgery. Scrub using a back-and-forth motion for 3 minutes. The area on your body should be completely wet with CHG when you are done scrubbing. 2. Do not rinse. Discard the cloth and let the area air-dry. Do not put any substances on the area afterward, such as powder, lotion, or perfume. 3. Put on clean clothes or pajamas. 4. If it is the night before your surgery, sleep in clean sheets.  For general bathing Follow these steps when using CHG cloths for general bathing (unless your health care provider gives you different instructions). 1. Use a separate CHG cloth for each area of your body. Make sure you wash between any folds of skin and between your fingers and toes. Wash your body in the following order, switching to a new cloth after each step: ? The front of your neck, shoulders, and chest. ? Both of your arms, under your arms, and your hands. ? Your stomach and groin area, avoiding the genitals. ? Your right leg and foot. ? Your left leg and foot. ? The back of your neck, your back, and your  buttocks. 2. Do not rinse. Discard the cloth and let the area air-dry. Do not put any substances on your body afterward-such as powder, lotion, or perfume-unless you are told to do so by your health care provider. Only use lotions that are recommended by the manufacturer. 3. Put on clean clothes or pajamas. Contact a health care provider if:   Your skin gets irritated after scrubbing.  You have questions about using your solution or cloth. Get help right away if:  Your eyes become very red or swollen.  Your eyes itch badly.  Your skin itches badly and is red or swollen.  Your hearing changes.  You have trouble seeing.  You have swelling or tingling in your mouth or throat.  You have trouble breathing.  You swallow any chlorhexidine. Summary  Chlorhexidine gluconate (CHG) is a germ-killing (antiseptic) solution that is used to clean the skin. Cleaning your skin with CHG may help to lower your risk for infection.  You may be given CHG to use for bathing. It may be in a bottle or in a prepackaged cloth to use on your skin. Carefully follow your health care provider's instructions and the instructions on the product label.  Do not use CHG if you have a chlorhexidine allergy.  Contact your health care provider if your skin gets irritated after scrubbing. This information is not intended to replace advice given to you by your health care provider. Make sure you discuss any questions you have with your health care provider. Document Released: 10/23/2011 Document Revised: 04/16/2018 Document Reviewed: 12/26/2016 Elsevier Patient Education  2020 [employer].

## 2018-11-23 ENCOUNTER — Encounter (HOSPITAL_COMMUNITY)
Admission: RE | Admit: 2018-11-23 | Discharge: 2018-11-23 | Disposition: A | Discharge status: Home / Self Care | Payer: Medicare HMO | Source: Ambulatory Visit | Attending: General Surgery | Admitting: General Surgery

## 2018-11-23 ENCOUNTER — Other Ambulatory Visit: Payer: Self-pay

## 2018-11-23 ENCOUNTER — Other Ambulatory Visit (HOSPITAL_COMMUNITY)
Admission: RE | Admit: 2018-11-23 | Discharge: 2018-11-23 | Disposition: A | Discharge status: Home / Self Care | Payer: Medicare HMO | Source: Ambulatory Visit | Attending: General Surgery | Admitting: General Surgery

## 2018-11-23 ENCOUNTER — Encounter (HOSPITAL_COMMUNITY): Payer: Self-pay

## 2018-11-23 DIAGNOSIS — I10 Essential (primary) hypertension: Secondary | ICD-10-CM | POA: Insufficient documentation

## 2018-11-23 DIAGNOSIS — Z01818 Encounter for other preprocedural examination: Secondary | ICD-10-CM | POA: Diagnosis not present

## 2018-11-23 DIAGNOSIS — Z20828 Contact with and (suspected) exposure to other viral communicable diseases: Secondary | ICD-10-CM | POA: Insufficient documentation

## 2018-11-23 DIAGNOSIS — L723 Sebaceous cyst: Secondary | ICD-10-CM | POA: Insufficient documentation

## 2018-11-23 LAB — BASIC METABOLIC PANEL
Anion gap: 12 (ref 5–15)
BUN: 14 mg/dL (ref 8–23)
CO2: 24 mmol/L (ref 22–32)
Calcium: 9.8 mg/dL (ref 8.9–10.3)
Chloride: 103 mmol/L (ref 98–111)
Creatinine, Ser: 0.91 mg/dL (ref 0.61–1.24)
GFR calc Af Amer: >60 mL/min (ref >60)
GFR calc non Af Amer: >60 mL/min (ref >60)
Glucose, Bld: 113 mg/dL — ABNORMAL HIGH (ref 70–99)
Potassium: 4.4 mmol/L (ref 3.5–5.1)
Sodium: 139 mmol/L (ref 135–145)

## 2018-11-23 LAB — HEMOGLOBIN A1C
Hgb A1c MFr Bld: 6.2 % — ABNORMAL HIGH (ref 4.8–5.6)
Mean Plasma Glucose: 131.24 mg/dL

## 2018-11-23 LAB — SARS CORONAVIRUS 2 (TAT 6-24 HRS): SARS Coronavirus 2: NEGATIVE

## 2018-11-25 ENCOUNTER — Other Ambulatory Visit: Payer: Self-pay

## 2018-11-25 ENCOUNTER — Encounter (HOSPITAL_COMMUNITY): Payer: Self-pay

## 2018-11-25 ENCOUNTER — Ambulatory Visit (HOSPITAL_COMMUNITY): Payer: Medicare HMO | Admitting: Anesthesiology

## 2018-11-25 ENCOUNTER — Encounter (HOSPITAL_COMMUNITY)
Admission: RE | Disposition: A | Discharge status: Home / Self Care | Payer: Self-pay | Source: Home / Self Care | Attending: General Surgery

## 2018-11-25 ENCOUNTER — Ambulatory Visit (HOSPITAL_COMMUNITY)
Admission: RE | Admit: 2018-11-25 | Discharge: 2018-11-25 | Disposition: A | Discharge status: Home / Self Care | Payer: Medicare HMO | Attending: General Surgery | Admitting: General Surgery

## 2018-11-25 DIAGNOSIS — Z7951 Long term (current) use of inhaled steroids: Secondary | ICD-10-CM | POA: Diagnosis not present

## 2018-11-25 DIAGNOSIS — E785 Hyperlipidemia, unspecified: Secondary | ICD-10-CM | POA: Insufficient documentation

## 2018-11-25 DIAGNOSIS — Z7984 Long term (current) use of oral hypoglycemic drugs: Secondary | ICD-10-CM | POA: Insufficient documentation

## 2018-11-25 DIAGNOSIS — I10 Essential (primary) hypertension: Secondary | ICD-10-CM | POA: Insufficient documentation

## 2018-11-25 DIAGNOSIS — J449 Chronic obstructive pulmonary disease, unspecified: Secondary | ICD-10-CM | POA: Diagnosis not present

## 2018-11-25 DIAGNOSIS — E119 Type 2 diabetes mellitus without complications: Secondary | ICD-10-CM | POA: Insufficient documentation

## 2018-11-25 DIAGNOSIS — N529 Male erectile dysfunction, unspecified: Secondary | ICD-10-CM | POA: Insufficient documentation

## 2018-11-25 DIAGNOSIS — L089 Local infection of the skin and subcutaneous tissue, unspecified: Secondary | ICD-10-CM | POA: Diagnosis not present

## 2018-11-25 DIAGNOSIS — Z87891 Personal history of nicotine dependence: Secondary | ICD-10-CM | POA: Insufficient documentation

## 2018-11-25 DIAGNOSIS — L723 Sebaceous cyst: Secondary | ICD-10-CM | POA: Insufficient documentation

## 2018-11-25 DIAGNOSIS — L729 Follicular cyst of the skin and subcutaneous tissue, unspecified: Secondary | ICD-10-CM | POA: Diagnosis not present

## 2018-11-25 DIAGNOSIS — E7800 Pure hypercholesterolemia, unspecified: Secondary | ICD-10-CM | POA: Diagnosis not present

## 2018-11-25 DIAGNOSIS — Z7982 Long term (current) use of aspirin: Secondary | ICD-10-CM | POA: Diagnosis not present

## 2018-11-25 DIAGNOSIS — Z79899 Other long term (current) drug therapy: Secondary | ICD-10-CM | POA: Insufficient documentation

## 2018-11-25 DIAGNOSIS — Z791 Long term (current) use of non-steroidal anti-inflammatories (NSAID): Secondary | ICD-10-CM | POA: Diagnosis not present

## 2018-11-25 HISTORY — PX: CYST EXCISION: SHX5701

## 2018-11-25 LAB — GLUCOSE, CAPILLARY
Glucose-Capillary: 138 mg/dL — ABNORMAL HIGH (ref 70–99)
Glucose-Capillary: 155 mg/dL — ABNORMAL HIGH (ref 70–99)

## 2018-11-25 SURGERY — CYST REMOVAL
Anesthesia: General | Site: Back

## 2018-11-25 MED ORDER — SUGAMMADEX SODIUM 500 MG/5ML IV SOLN
INTRAVENOUS | Status: DC | PRN
  Administered 2018-11-25: 300 mg via INTRAVENOUS

## 2018-11-25 MED ORDER — PROPOFOL 10 MG/ML IV BOLUS
INTRAVENOUS | Status: DC | PRN
  Administered 2018-11-25: 30 mg via INTRAVENOUS
  Administered 2018-11-25: 200 mg via INTRAVENOUS

## 2018-11-25 MED ORDER — ROCURONIUM BROMIDE 100 MG/10ML IV SOLN
INTRAVENOUS | Status: DC | PRN
  Administered 2018-11-25: 20 mg via INTRAVENOUS

## 2018-11-25 MED ORDER — BUPIVACAINE HCL (PF) 0.5 % IJ SOLN
INTRAMUSCULAR | Status: DC | PRN
  Administered 2018-11-25: 10 mL

## 2018-11-25 MED ORDER — MIDAZOLAM HCL 2 MG/2ML IJ SOLN: 0.5000 mg | Freq: Once | INTRAMUSCULAR | Status: DC | PRN

## 2018-11-25 MED ORDER — OXYCODONE HCL 5 MG PO TABS: 5.0000 mg | ORAL_TABLET | ORAL | 0 refills | Status: DC | PRN

## 2018-11-25 MED ORDER — CEFAZOLIN SODIUM-DEXTROSE 2-4 GM/100ML-% IV SOLN
2.0000 g | INTRAVENOUS | Status: AC
  Administered 2018-11-25: 2 g via INTRAVENOUS
  Filled 2018-11-25: qty 100

## 2018-11-25 MED ORDER — BUPIVACAINE HCL (PF) 0.5 % IJ SOLN
INTRAMUSCULAR | Status: AC
  Filled 2018-11-25: qty 30

## 2018-11-25 MED ORDER — DEXAMETHASONE SODIUM PHOSPHATE 10 MG/ML IJ SOLN
INTRAMUSCULAR | Status: DC | PRN
  Administered 2018-11-25: 5 mg via INTRAVENOUS

## 2018-11-25 MED ORDER — FENTANYL CITRATE (PF) 100 MCG/2ML IJ SOLN
INTRAMUSCULAR | Status: DC | PRN
  Administered 2018-11-25 (×2): 50 ug via INTRAVENOUS

## 2018-11-25 MED ORDER — DOCUSATE SODIUM 100 MG PO CAPS: 100.0000 mg | ORAL_CAPSULE | Freq: Two times a day (BID) | ORAL | 2 refills | Status: AC

## 2018-11-25 MED ORDER — DEXAMETHASONE SODIUM PHOSPHATE 10 MG/ML IJ SOLN
INTRAMUSCULAR | Status: AC
  Filled 2018-11-25: qty 1

## 2018-11-25 MED ORDER — 0.9 % SODIUM CHLORIDE (POUR BTL) OPTIME
TOPICAL | Status: DC | PRN
  Administered 2018-11-25: 08:00:00 1000 mL

## 2018-11-25 MED ORDER — SEVOFLURANE IN SOLN
RESPIRATORY_TRACT | Status: AC
  Filled 2018-11-25: qty 250

## 2018-11-25 MED ORDER — CHLORHEXIDINE GLUCONATE CLOTH 2 % EX PADS: 6.0000 | MEDICATED_PAD | Freq: Once | CUTANEOUS | Status: DC

## 2018-11-25 MED ORDER — SUCCINYLCHOLINE CHLORIDE 200 MG/10ML IV SOSY
PREFILLED_SYRINGE | INTRAVENOUS | Status: AC
  Filled 2018-11-25: qty 10

## 2018-11-25 MED ORDER — FENTANYL CITRATE (PF) 100 MCG/2ML IJ SOLN
INTRAMUSCULAR | Status: AC
  Filled 2018-11-25: qty 2

## 2018-11-25 MED ORDER — PROMETHAZINE HCL 25 MG/ML IJ SOLN: 6.2500 mg | INTRAMUSCULAR | Status: DC | PRN

## 2018-11-25 MED ORDER — ONDANSETRON HCL 4 MG/2ML IJ SOLN
INTRAMUSCULAR | Status: AC
  Filled 2018-11-25: qty 2

## 2018-11-25 MED ORDER — ROCURONIUM BROMIDE 10 MG/ML (PF) SYRINGE
PREFILLED_SYRINGE | INTRAVENOUS | Status: AC
  Filled 2018-11-25: qty 10

## 2018-11-25 MED ORDER — ONDANSETRON HCL 4 MG/2ML IJ SOLN
INTRAMUSCULAR | Status: DC | PRN
  Administered 2018-11-25: 4 mg via INTRAVENOUS

## 2018-11-25 MED ORDER — LACTATED RINGERS IV SOLN
INTRAVENOUS | Status: DC
  Administered 2018-11-25: 07:00:00 via INTRAVENOUS

## 2018-11-25 MED ORDER — HYDROCODONE-ACETAMINOPHEN 7.5-325 MG PO TABS
1.0000 | ORAL_TABLET | Freq: Once | ORAL | Status: AC | PRN
  Administered 2018-11-25: 09:00:00 1 via ORAL
  Filled 2018-11-25: qty 1

## 2018-11-25 MED ORDER — HYDROMORPHONE HCL 1 MG/ML IJ SOLN: 0.2500 mg | INTRAMUSCULAR | Status: DC | PRN

## 2018-11-25 MED ORDER — SUCCINYLCHOLINE CHLORIDE 20 MG/ML IJ SOLN
INTRAMUSCULAR | Status: DC | PRN
  Administered 2018-11-25: 120 mg via INTRAVENOUS

## 2018-11-25 SURGICAL SUPPLY — 30 items
CLOTH BEACON ORANGE TIMEOUT ST (SAFETY) ×2 IMPLANT
COVER LIGHT HANDLE STERIS (MISCELLANEOUS) ×4 IMPLANT
DECANTER SPIKE VIAL GLASS SM (MISCELLANEOUS) ×2 IMPLANT
ELECT NDL TIP 2.8 STRL (NEEDLE) ×1 IMPLANT
ELECT NEEDLE TIP 2.8 STRL (NEEDLE) ×2 IMPLANT
ELECT REM PT RETURN 9FT ADLT (ELECTROSURGICAL) ×2
ELECTRODE REM PT RTRN 9FT ADLT (ELECTROSURGICAL) ×1 IMPLANT
GAUZE KERLIX 2X3 DERM STRL LF (GAUZE/BANDAGES/DRESSINGS) ×1 IMPLANT
GLOVE BIO SURGEON STRL SZ 6.5 (GLOVE) ×4 IMPLANT
GLOVE BIO SURGEON STRL SZ7 (GLOVE) ×2 IMPLANT
GLOVE BIOGEL PI IND STRL 6.5 (GLOVE) ×1 IMPLANT
GLOVE BIOGEL PI IND STRL 7.0 (GLOVE) ×1 IMPLANT
GLOVE BIOGEL PI INDICATOR 6.5 (GLOVE) ×1
GLOVE BIOGEL PI INDICATOR 7.0 (GLOVE) ×1
GOWN STRL REUS W/TWL LRG LVL3 (GOWN DISPOSABLE) ×4 IMPLANT
KIT TURNOVER KIT A (KITS) ×2 IMPLANT
MANIFOLD NEPTUNE II (INSTRUMENTS) ×2 IMPLANT
NDL HYPO 25X1 1.5 SAFETY (NEEDLE) ×1 IMPLANT
NEEDLE HYPO 25X1 1.5 SAFETY (NEEDLE) ×2 IMPLANT
NS IRRIG 1000ML POUR BTL (IV SOLUTION) ×2 IMPLANT
PACK MINOR (CUSTOM PROCEDURE TRAY) ×2 IMPLANT
PAD ABD 5X9 TENDERSORB (GAUZE/BANDAGES/DRESSINGS) ×1 IMPLANT
PAD ARMBOARD 7.5X6 YLW CONV (MISCELLANEOUS) ×2 IMPLANT
SET BASIN LINEN APH (SET/KITS/TRAYS/PACK) ×2 IMPLANT
SUT ETHILON 3 0 FSL (SUTURE) ×1 IMPLANT
SUT VIC AB 3-0 SH 27 (SUTURE) ×2
SUT VIC AB 3-0 SH 27X BRD (SUTURE) IMPLANT
SYR BULB IRRIGATION 50ML (SYRINGE) ×2 IMPLANT
SYR CONTROL 10ML LL (SYRINGE) ×2 IMPLANT
TAPE PAPER 1X10 WHT MICROPORE (GAUZE/BANDAGES/DRESSINGS) ×1 IMPLANT

## 2018-11-25 NOTE — Anesthesia Preprocedure Evaluation (Signed)
Anesthesia Evaluation  Patient identified by MRN, date of birth, ID band Patient awake    Reviewed: Allergy & Precautions, NPO status , Patient's Chart, lab work & pertinent test results, reviewed documented beta blocker date and time   Airway Mallampati: I  TM Distance: >3 FB Neck ROM: Full    Dental no notable dental hx. (+) Edentulous Upper, Edentulous Lower   Pulmonary COPD, former smoker,  COPD on chart  Pt denies and denies o2 or inhaler use  DOE also on chart -pt denies DOE   Pulmonary exam normal breath sounds clear to auscultation       Cardiovascular Exercise Tolerance: Good hypertension, Pt. on medications and Pt. on home beta blockers + DOE  Normal cardiovascular examI Rhythm:Regular Rate:Normal  Pt denies DOE when questioned  Last ECG -NSR-Normal    Neuro/Psych negative neurological ROS  negative psych ROS   GI/Hepatic negative GI ROS, Neg liver ROS,   Endo/Other  negative endocrine ROSdiabetes  Renal/GU negative Renal ROS  negative genitourinary   Musculoskeletal negative musculoskeletal ROS (+)   Abdominal   Peds negative pediatric ROS (+)  Hematology negative hematology ROS (+)   Anesthesia Other Findings   Reproductive/Obstetrics negative OB ROS                             Anesthesia Physical Anesthesia Plan  ASA: II  Anesthesia Plan: General   Post-op Pain Management:    Induction: Intravenous  PONV Risk Score and Plan: 2 and Ondansetron, Dexamethasone and Treatment may vary due to age or medical condition  Airway Management Planned: Oral ETT and LMA  Additional Equipment:   Intra-op Plan:   Post-operative Plan: Extubation in OR  Informed Consent: I have reviewed the patients History and Physical, chart, labs and discussed the procedure including the risks, benefits and alternatives for the proposed anesthesia with the patient or authorized  representative who has indicated his/her understanding and acceptance.     Dental advisory given  Plan Discussed with: CRNA  Anesthesia Plan Comments: (Plan Full PPE use Plan GA(LMA) vs GETA D/W PT -WTP with same after Q&A Lateral position planned )        Anesthesia Quick Evaluation

## 2018-11-25 NOTE — Progress Notes (Signed)
[facility]  Spoke with [facility] and notified of the need for packing. Will plan for follow up in office tomorrow to educate on packing the area. She feels like she will be able to do this without issues. Spoke with Dr. Juel Burrow office, [place], regarding need for additional pain medication due to the excision and packing. They are ok with this need.   Curlene Labrum, MD [facility], [place] [address] [place],  [address] [phone] (office)

## 2018-11-25 NOTE — Anesthesia Postprocedure Evaluation (Signed)
Anesthesia Post Note  Patient: Jerome Gonzalez  Procedure(s) Performed: EXCISION CYST 4CM  BACK (N/A Back)  Patient location during evaluation: Phase II Anesthesia Type: General Level of consciousness: awake and alert and patient cooperative Pain management: satisfactory to patient Vital Signs Assessment: post-procedure vital signs reviewed and stable Respiratory status: spontaneous breathing Cardiovascular status: stable Postop Assessment: no apparent nausea or vomiting Anesthetic complications: no     Last Vitals:  Vitals:   11/25/18 0900 11/25/18 0919  BP: 101/61 122/61  Pulse: (!) 54 (!) 57  Resp: 18 16  Temp:  36.7 C  SpO2: 92% 94%    Last Pain:  Vitals:   11/25/18 0919  TempSrc: Oral  PainSc: 0-No pain                 Shailene Demonbreun

## 2018-11-25 NOTE — H&P (Signed)
[facility] History and Physical  Reason for Referral: Sebaceous cyst back  Referring Physician: Celene Squibb, MD     Chief Complaint    Abscess      Jerome Gonzalez is a 78 y.o. male.  HPI: Jerome Gonzalez is a 78 yo who has DM, HTN, HLD and reports having an area on his back that swellings and drains periodically. He says it has been occurring over the last several months, and that he was seen by his PCP about this at the end of August. He has never had an aspiration, drainage or antibiotics for the area prior.  He says that this all started happening < 1 year ago. He has had other cysts from his back in the past. He says that the area has drained some, and the pressure and tightness has improved. He denies any fevers or chills.       Past Medical History:  Diagnosis Date  . Cholelithiasis    Asymptomatic  . COPD (chronic obstructive pulmonary disease) (HCC)    Noted on chest CT done to f/u ? pulm nodule noted on CXR-(mild emphysema + asymptomatic gallstone: no pulm nodule.)  . Diabetes mellitus   . DOE (dyspnea on exertion) 11/2010   PFTs and EKG normal at prior PMD's office.  . Erectile dysfunction   . History of cluster headache 2012   Percocet aborted them  . Hypercholesterolemia   . Hypertension   . Insomnia    Nocturnal oxygen monitoring NORMAL 2012  . Rectal bleeding 04/2011   TCS planned but then decided on anusol supp and this helped significantly: GI agreed w/pt that it was ok to wait until 12/2012 to do his "regular" 10 yr recall for screening TCS.         Past Surgical History:  Procedure Laterality Date  . COLONOSCOPY  Nov 2004   RMR: friable anal canal, left-sided diverticula.  Repeat with [facility] (Dr. Sydell Axon) is planned for 2014  . foot surgeries     X 3, as child         Family History  Problem Relation Age of Onset  . Colon cancer Neg Hx     Social History        Tobacco Use  . Smoking  status: Former Smoker    Packs/day: 1.00    Types: Cigarettes  . Smokeless tobacco: Former Systems developer    Quit date: 03/01/1983  Substance Use Topics  . Alcohol use: No  . Drug use: No    Medications: I have reviewed the patient's current medications. Allergies as of 10/20/2018   No Known Allergies        Medication List       Accurate as of October 20, 2018 11:24 AM. If you have any questions, ask your nurse or doctor.        aspirin 81 MG tablet Take 81 mg by mouth daily.   atorvastatin 20 MG tablet Commonly known as: LIPITOR Take 1 tablet (20 mg total) by mouth daily.   fish oil-omega-3 fatty acids 1000 MG capsule Take 2 g by mouth daily.   fluticasone 50 MCG/ACT nasal spray Commonly known as: FLONASE Place 2 sprays into the nose daily.   HYDROcodone-acetaminophen 10-325 MG tablet Commonly known as: NORCO TAKE 1 2 TO 1 (ONE HALF TO ONE) TABLET BY MOUTH EVERY 8 HOURS AS NEEDED FOR PAIN   lisinopril 20 MG tablet Commonly known as: ZESTRIL Take 20 mg by mouth daily.  metFORMIN 500 MG tablet Commonly known as: GLUCOPHAGE Take 500 mg by mouth 2 (two) times daily with a meal.   metoprolol succinate 25 MG 24 hr tablet Commonly known as: TOPROL-XL 1 tab po qAM   multivitamin capsule Take 1 capsule by mouth daily.   nabumetone 750 MG tablet Commonly known as: RELAFEN Take 750 mg by mouth daily.   oxyCODONE-acetaminophen 5-325 MG tablet Commonly known as: PERCOCET/ROXICET   rosuvastatin 10 MG tablet Commonly known as: CRESTOR   sulfamethoxazole-trimethoprim 800-160 MG tablet Commonly known as: BACTRIM DS Take 1 tablet by mouth 2 (two) times daily for 7 days. Started by: Lucretia Roers, MD        ROS:  A comprehensive review of systems was negative except for: Musculoskeletal: positive for back pain, neck pain, stiff joints and drain cyst on back Endocrine: positive for diabetes  Blood pressure 129/72, pulse 77,  temperature 97.8 F (36.6 C), temperature source Tympanic, resp. rate 16, height 6\' 2"  (1.88 m), weight 281 lb (127.5 kg), SpO2 94 %. Physical Exam Vitals signs reviewed.  Constitutional:      Appearance: Normal appearance.  HENT:     Head: Normocephalic and atraumatic.     Nose: Nose normal.     Mouth/Throat:     Mouth: Mucous membranes are moist.  Eyes:     Extraocular Movements: Extraocular movements intact.     Pupils: Pupils are equal, round, and reactive to light.  Neck:     Musculoskeletal: Normal range of motion and neck supple.  Cardiovascular:     Rate and Rhythm: Normal rate and regular rhythm.  Pulmonary:     Effort: Pulmonary effort is normal.     Breath sounds: Normal breath sounds.  Chest:     Comments: Mid lower back with 4cm swollen area with fluctuance, minor erythema overlying the area, punctate central area with drainage, tender; area cleaned with prep and aspiration of 5cc of brown purulent fluid Abdominal:     General: There is no distension.     Palpations: Abdomen is soft.     Tenderness: There is no abdominal tenderness.  Musculoskeletal: Normal range of motion.        General: No swelling.  Skin:    General: Skin is warm and dry.  Neurological:     General: No focal deficit present.     Mental Status: He is alert and oriented to person, place, and time.  Psychiatric:        Mood and Affect: Mood normal.        Behavior: Behavior normal.        Thought Content: Thought content normal.        Judgment: Judgment normal.     Results: None  Assessment & Plan:  Jerome Gonzalez is a 78 y.o. male with an infected sebaceous cyst with active erythema and drainage. He is having some tenderness and pain, so this was aspirated to give him some relief currently.  He has never received antibiotics for this cyst.    -Discussed role of surgery to remove the cyst, risk of bleeding, infection, recurrence, and need for sedation due to the size and need for  potential extensive dissection. Discussed possible need for packing after excision due to the active infection.    63, MD [facility] [address] [place], [place] [phone] (office)

## 2018-11-25 NOTE — Transfer of Care (Signed)
Immediate Anesthesia Transfer of Care Note  Patient: Jerome Gonzalez  Procedure(s) Performed: EXCISION CYST 4CM  BACK (N/A Back)  Patient Location: PACU  Anesthesia Type:General  Level of Consciousness: awake  Airway & Oxygen Therapy: Patient Spontanous Breathing  Post-op Assessment: Report given to RN and Post -op Vital signs reviewed and stable  Post vital signs: Reviewed and stable  Last Vitals:  Vitals Value Taken Time  BP 112/63   Temp    Pulse 57 11/25/18 0845  Resp 15 11/25/18 0845  SpO2 94 % 11/25/18 0845  Vitals shown include unvalidated device data.  Last Pain:  Vitals:   11/25/18 0647  TempSrc: Oral  PainSc: 0-No pain     SEE PACU FLOW SHEET FOR VITAL SIGNS Patients Stated Pain Goal: 7 (24/49/75 3005)  Complications: No apparent anesthesia complications

## 2018-11-25 NOTE — Op Note (Addendum)
[facility] Operative Note  11/25/18  Preoperative Diagnosis:  Infected Sebaceous cyst on back   Postoperative Diagnosis:  Same   Procedure(s) Performed: Excision of sebaceous cyst from back, 6 cm    Surgeon: Lanell Matar. Constance Haw, MD   Assistants: No qualified resident was available    Anesthesia: General endotracheal   Anesthesiologist: Lenice Llamas, MD    Specimens: Sebaceous cyst    Estimated Blood Loss: Minimal   Blood Replacement: None    Complications: None   Wound Class: Infected    Operative Indications: Jerome Gonzalez is a 78 yo with a sebaceous cyst that has been infected on and off for the last few weeks, and he is ready to get this excised. We discussed the risk and benefits of excision including bleeding, infection, recurrence, and need to leave the area open after excision due to active infection, and he has expressed understanding and wants to proceed.   Findings: Large, lobulated cyst with keratinous material inside, purulent drainage    Procedure: The patient was taken to the operating room and placed supine. General endotracheal anesthesia was induced. Intravenous antibiotics were administered per protocol.  The patient was then turned right lateral decubitus position with all pressure points padded.  The back was prepared and draped in the usual sterile fashion.   The area was erythematous with purulent drainage on palpation, and a mucosal papilla of tissue had formed at one of the openings.  An elliptical incision was made around this entire area and carried down through the dermis and in to the subcutaneous tissue with care using cautery.  The cyst was large and lobulated with projections extending deep into the subcutaneous tissue.  The cyst was excised in its entirety and some keratin contents were spilled. The cavity was large, and I cauterized the entire cavity to ensure no cyst wall was left behind.  The cavity was irrigated. Due to the large  and infected nature of this cyst, I did not feel that closing the tissue was going heal, and this will need to heal by secondary intention.   Final inspection revealed acceptable hemostasis. The area was packed with saline dampened kerlix gauze and covered with an ABD and tape. All counts were correct at the end of the case. The patient was awakened from anesthesia and extubated without complication.  The patient went to the PACU in stable condition.   Curlene Labrum, MD [facility] [address] [place], [place] [address] [phone] (office)

## 2018-11-25 NOTE — Anesthesia Procedure Notes (Signed)
Procedure Name: Intubation Date/Time: 11/25/2018 7:40 AM Performed by: Vista Deck, CRNA Pre-anesthesia Checklist: Patient identified, Emergency Drugs available, Suction available, Patient being monitored and Timeout performed Patient Re-evaluated:Patient Re-evaluated prior to induction Oxygen Delivery Method: Circle system utilized Preoxygenation: Pre-oxygenation with 100% oxygen Induction Type: IV induction Laryngoscope Size: Glidescope and 3 Grade View: Grade I Tube type: Oral Tube size: 8.0 mm Number of attempts: 1 Airway Equipment and Method: Video-laryngoscopy and Stylet Placement Confirmation: ETT inserted through vocal cords under direct vision,  positive ETCO2 and breath sounds checked- equal and bilateral Secured at: 22 cm Tube secured with: Tape Dental Injury: Teeth and Oropharynx as per pre-operative assessment

## 2018-11-25 NOTE — Interval H&P Note (Signed)
History and Physical Interval Note:  11/25/2018 7:23 AM  Jerome Gonzalez  has presented today for surgery, with the diagnosis of sebaceous cyst on back.  The various methods of treatment have been discussed with the patient and family. After consideration of risks, benefits and other options for treatment, the patient has consented to  Procedure(s): EXCISION CYST 4CM  BACK (N/A) as a surgical intervention.  The patient's history has been reviewed, patient examined, no change in status, stable for surgery.  I have reviewed the patient's chart and labs.  Questions were answered to the patient's satisfaction.    Cyst has previously been aspirated. No changes in health. Normal work breathing, regular rate, having drainage from the area and there is erythema and a papilla of mucosa from the skin where the drainage has been occurring.  Will need elliptical excision and packing due to this finding more than likely. Patient aware and in agreement.   Virl Cagey

## 2018-11-25 NOTE — Discharge Instructions (Signed)
Keep the area dry.  You will be packing the area one a day with saline dampened kerlix gauze and covering it with a pad and paper tape. I will teach Jerome Gonzalez how to do this tomorrow. Take ibuprofen and tylenol as needed for pain, and take your Percocet for your chronic pain.  Additional pain medications have been sent in case you need more than your normal percocet prescription. Dr. Juel Burrow office is aware of this prescription. Do not shower until seeing Dr. Constance Haw in the office Thursday 11/26/2018.  You can do a bird bath/ sponge bath prior to this visit if needed.   Epidermal Cyst Removal, Care After This sheet gives you information about how to care for yourself after your procedure. Your health care provider may also give you more specific instructions. If you have problems or questions, contact your health care provider. What can I expect after the procedure? After the procedure, it is common to have:  Soreness in the area where your cyst was removed.  Tightness or itchiness from the stitches (sutures) in your skin. Follow these instructions at home: Medicines  Take over-the-counter and prescription medicines only as told by your health care provider.  If you were prescribed an antibiotic medicine or ointment, take or apply it as told by your health care provider. Do not stop using the antibiotic even if you start to feel better. Incision care   Follow instructions from your health care provider about how to take care of your incision. Make sure you: ? Change your dressing as told by your health care provider.  Keep the dressingdry until your health care provider says that it can be removed.  After your dressing is off, check your incision area every day for signs of infection. Check for: ? Redness, swelling, or pain. ? Fluid or blood. ? Warmth. ? Pus or a bad smell. General instructions  Do not take baths, swim, or use a hot tub until your health care provider approves. Ask  your health care provider if you may take showers. You may only be allowed to take sponge baths.  Your health care provider may ask you to avoid contact sports or activities that take a lot of effort. Do not do anything that stretches or puts pressure on your incision.  You can return to your normal diet.  Keep all follow-up visits as told by your health care provider. This is important. Contact a health care provider if:  You have a fever.  You have redness, swelling, or pain in the incision area.  You have fluid or blood coming from your incision.  You have pus or a bad smell coming from your incision.  Your incision feels warm to the touch.  Your cyst grows back. Summary  After the procedure, it is common to have soreness in the area where your cyst was removed.  Take or apply over-the-counter and prescription medicines only as told by your health care provider.  Follow instructions from your health care provider about how to take care of your incision. This information is not intended to replace advice given to you by your health care provider. Make sure you discuss any questions you have with your health care provider. Document Released: 02/18/2014 Document Revised: 05/20/2017 Document Reviewed: 11/21/2016 Elsevier Patient Education  2020 [employer].

## 2018-11-26 ENCOUNTER — Encounter (HOSPITAL_COMMUNITY): Payer: Self-pay | Admitting: General Surgery

## 2018-11-26 ENCOUNTER — Ambulatory Visit (INDEPENDENT_AMBULATORY_CARE_PROVIDER_SITE_OTHER): Payer: Self-pay | Admitting: General Surgery

## 2018-11-26 VITALS — BP 141/70 | HR 80 | Temp 97.3°F | Resp 16 | Ht 74.0 in | Wt 284.0 lb

## 2018-11-26 DIAGNOSIS — L089 Local infection of the skin and subcutaneous tissue, unspecified: Secondary | ICD-10-CM

## 2018-11-26 DIAGNOSIS — L723 Sebaceous cyst: Secondary | ICD-10-CM

## 2018-11-26 LAB — SURGICAL PATHOLOGY

## 2018-11-26 NOTE — Patient Instructions (Signed)
Pack daily with saline dampened gauze and cover with pad and paper tape. Shower with dressing on and replace that day. Call if concerns. Stretch and move as able to.

## 2018-11-27 NOTE — Progress Notes (Signed)
[facility] Note   HPI:  78 y.o. Male presents to clinic for post-op follow-up evaluation to teach his wife how to pack the area. He underwent excision of an infected sebaceous cyst yesterday. Patient reports some pain and soreness around his flank. He otherwise is doing well.   Review of Systems:  Some drainage from the dressing Soreness All other review of systems: otherwise negative   Vital Signs:  BP (!) 141/70 (BP Location: Left Arm, Patient Position: Sitting, Cuff Size: Normal)   Pulse 80   Temp (!) 97.3 F (36.3 C) (Tympanic)   Resp 16   Ht 6\' 2"  (1.88 m)   Wt 284 lb (128.8 kg)   SpO2 94%   BMI 36.46 kg/m    Physical Exam:  Physical Exam Cardiovascular:     Rate and Rhythm: Normal rate.  Pulmonary:     Effort: Pulmonary effort is normal.  Musculoskeletal: Normal range of motion.     Comments: Back wound packing removed, no bleeding or drainage, cavity is at least 6X4cm in size in the central left back, packing replaced and bandage placed   Skin:    General: Skin is warm.  Neurological:     General: No focal deficit present.     Assessment:  78 y.o. yo Male with a open wound that will need packing after excision of a very large sebaceous cyst. His wife was instructed on how to do this packing, and feels comfortable with the packing plan. Supplies were given to the patient at the hospital.   Plan:  - Pack daily with saline dampened gauze and cover with pad and paper tape. - Shower with dressing on and replace that day. - Call if concerns. - Stretch and move as able to.   - Wound check in 2 weeks  Future Appointments  Date Time Provider [place]  12/09/2018  1:00 PM Virl Cagey, MD [facility] None    All of the above recommendations were discussed with the patient and patient's family, and all of patient's and family's questions were answered to their expressed satisfaction.  Curlene Labrum, MD [facility] [address] [place], [place] [address] [phone] (office)

## 2018-12-09 ENCOUNTER — Telehealth: Payer: Self-pay | Admitting: General Surgery

## 2018-12-09 ENCOUNTER — Other Ambulatory Visit: Payer: Self-pay

## 2018-12-09 ENCOUNTER — Encounter: Payer: Self-pay | Admitting: General Surgery

## 2018-12-09 ENCOUNTER — Ambulatory Visit (INDEPENDENT_AMBULATORY_CARE_PROVIDER_SITE_OTHER): Payer: Self-pay | Admitting: General Surgery

## 2018-12-09 VITALS — BP 137/69 | HR 73 | Temp 97.9°F | Resp 16 | Ht 74.0 in | Wt 278.0 lb

## 2018-12-09 DIAGNOSIS — L089 Local infection of the skin and subcutaneous tissue, unspecified: Secondary | ICD-10-CM

## 2018-12-09 DIAGNOSIS — L723 Sebaceous cyst: Secondary | ICD-10-CM

## 2018-12-09 MED ORDER — OXYCODONE HCL 5 MG PO TABS: 5.0000 mg | ORAL_TABLET | ORAL | 0 refills | Status: AC | PRN

## 2018-12-09 MED ORDER — OXYCODONE HCL 5 MG PO TABS: 5.0000 mg | ORAL_TABLET | ORAL | 0 refills | Status: DC | PRN

## 2018-12-09 NOTE — Telephone Encounter (Signed)
[facility]  Patient didn't answer. [facility] out of Roxicodone until first of week. If he needs before will need to send to another pharmacy. Otherwise he can keep it at [facility]. Have left him a message. Told him to call us if he wants it sent to another pharmacy.   Curlene Labrum, MD [facility] [address] [place], [place] [address] [phone]/ [phone] (office)

## 2018-12-09 NOTE — Telephone Encounter (Signed)
[facility]  Rx for roxicodone canceled at [facility] and sent to [facility].  Curlene Labrum, MD [facility] [address] [place], [place] [address] [phone]/ [phone] (office)

## 2018-12-09 NOTE — Progress Notes (Signed)
[facility] Note   HPI:  78 y.o. Male presents to clinic for post-op follow-up evaluation of the back wound. They have been packing it and things are going well. He is having some pain and needing some narcotics still. He otherwise is well. There has been some minor bleeding.    Review of Systems:  No fever or chills No drainage Improving pain Shoulder discomfort improving All other review of systems: otherwise negative   Vital Signs:  BP 137/69 (BP Location: Left Arm, Patient Position: Sitting, Cuff Size: Normal)   Pulse 73   Temp 97.9 F (36.6 C) (Oral)   Resp 16   Ht 6\' 2"  (1.88 m)   Wt 278 lb (126.1 kg)   SpO2 96%   BMI 35.69 kg/m    Physical Exam:  Physical Exam Cardiovascular:     Rate and Rhythm: Normal rate.  Pulmonary:     Effort: Pulmonary effort is normal.  Musculoskeletal:     Comments: Back wound healing, still tunnels cephalad, some granulation, no erythema or drainage, packing replaced  Neurological:     Mental Status: He is alert.      Assessment:  78 y.o. yo Male with an open wound that is being packed after excision of an infected cyst. He is doing well.  Plan:  -  Future Appointments  Date Time Provider [place]  01/06/2019  1:00 PM Virl Cagey, MD [facility] None     - Refilled the Roxicodone for 15 more tablets. He can take these as needed between his chronic pain medication. Encouraged tylenol and ibuprofen use.   All of the above recommendations were discussed with the patient and patient's family, and all of patient's and family's questions were answered to their expressed satisfaction.  Curlene Labrum, MD [facility] [address] [place], [place] [address] [phone] (office)

## 2018-12-09 NOTE — Patient Instructions (Signed)
Continue to pack as you have been.  0.9% NaCl (Normal Saline) from the pharmacy for the packing.

## 2018-12-31 DIAGNOSIS — Z23 Encounter for immunization: Secondary | ICD-10-CM | POA: Diagnosis not present

## 2019-01-06 ENCOUNTER — Other Ambulatory Visit: Payer: Self-pay

## 2019-01-06 ENCOUNTER — Encounter: Payer: Self-pay | Admitting: General Surgery

## 2019-01-06 ENCOUNTER — Ambulatory Visit (INDEPENDENT_AMBULATORY_CARE_PROVIDER_SITE_OTHER): Payer: Self-pay | Admitting: General Surgery

## 2019-01-06 VITALS — BP 132/65 | HR 64 | Temp 98.7°F | Resp 16 | Ht 74.0 in | Wt 278.0 lb

## 2019-01-06 DIAGNOSIS — L089 Local infection of the skin and subcutaneous tissue, unspecified: Secondary | ICD-10-CM

## 2019-01-06 DIAGNOSIS — L723 Sebaceous cyst: Secondary | ICD-10-CM

## 2019-01-06 NOTE — Progress Notes (Signed)
[facility] Note   HPI:  78 y.o. Male presents to clinic for follow-up evaluation of his back wound after excision of a cyst. He is doing well and packing is going well. He is healing.   Review of Systems:  No pain No drainage No redness All other review of systems: otherwise negative   Vital Signs:  BP 132/65 (BP Location: Left Arm, Patient Position: Sitting, Cuff Size: Large)   Pulse 64   Temp 98.7 F (37.1 C)   Resp 16   Ht 6\' 2"  (1.88 m)   Wt 278 lb (126.1 kg)   SpO2 96%   BMI 35.69 kg/m    Physical Exam:  Physical Exam Vitals signs reviewed.  HENT:     Nose: Nose normal.  Eyes:     Pupils: Pupils are equal, round, and reactive to light.  Cardiovascular:     Rate and Rhythm: Normal rate.  Pulmonary:     Effort: Pulmonary effort is normal.  Musculoskeletal:     Comments: Midline back wound 1.5cm total size, 1cm deep, epithelization on edges, no erythema   Skin:    General: Skin is dry.  Neurological:     Mental Status: He is alert.     Assessment:  78 y.o. yo Male with a healing excision of cyst wound. Doing well. Packing going well.   Plan:  - Continue packing until flat and superficial, then dry gauze over area.    Future Appointments  Date Time Provider [place]  02/03/2019  1:00 PM Virl Cagey, MD [facility] None   All of the above recommendations were discussed with the patient and patient's family, and all of patient's and family's questions were answered to their expressed satisfaction.  Curlene Labrum, MD [facility] [address] [place], [place] [address] [phone] (office)

## 2019-01-06 NOTE — Patient Instructions (Signed)
Continue packing until flat and superficial, then dry gauze over area.

## 2019-02-03 ENCOUNTER — Other Ambulatory Visit: Payer: Self-pay

## 2019-02-03 ENCOUNTER — Ambulatory Visit: Payer: Medicare HMO | Admitting: General Surgery

## 2019-02-03 ENCOUNTER — Ambulatory Visit (INDEPENDENT_AMBULATORY_CARE_PROVIDER_SITE_OTHER): Payer: Self-pay | Admitting: General Surgery

## 2019-02-03 ENCOUNTER — Encounter: Payer: Self-pay | Admitting: General Surgery

## 2019-02-03 VITALS — BP 130/69 | HR 66 | Temp 97.9°F | Resp 16 | Ht 74.0 in | Wt 277.0 lb

## 2019-02-03 DIAGNOSIS — L723 Sebaceous cyst: Secondary | ICD-10-CM

## 2019-02-03 DIAGNOSIS — L089 Local infection of the skin and subcutaneous tissue, unspecified: Secondary | ICD-10-CM

## 2019-02-03 NOTE — Progress Notes (Signed)
[facility] Note   HPI:  78 y.o. Male presents to clinic for follow-up evaluation after excision of an infected cyst from his back. He has been doing packing. The area is healed.   Review of Systems:  No drainage or redness  All other review of systems: otherwise negative   Vital Signs:  BP 130/69 (BP Location: Left Arm, Patient Position: Sitting, Cuff Size: Large)   Pulse 66   Temp 97.9 F (36.6 C) (Oral)   Resp 16   Ht 6\' 2"  (1.88 m)   Wt 277 lb (125.6 kg)   SpO2 95%   BMI 35.56 kg/m    Physical Exam:  Physical Exam Vitals reviewed.  HENT:     Head: Normocephalic.  Cardiovascular:     Rate and Rhythm: Normal rate.  Pulmonary:     Effort: Pulmonary effort is normal.  Chest:     Comments: Mid back with healed area after excision, small scab Neurological:     Mental Status: He is alert.      Assessment:  78 y.o. yo Male s/p excision of infected cyst and subsequent packing. Area has healed.  Plan:  - No further packing needed   - Follow up PRN  All of the above recommendations were discussed with the patient and patient's family, and all of patient's and family's questions were answered to their expressed satisfaction.  Curlene Labrum, MD [facility] [address] [place], [place] [address] [phone] (office)

## 2019-02-17 DIAGNOSIS — E119 Type 2 diabetes mellitus without complications: Secondary | ICD-10-CM | POA: Diagnosis not present

## 2019-03-04 DIAGNOSIS — E119 Type 2 diabetes mellitus without complications: Secondary | ICD-10-CM | POA: Diagnosis not present

## 2019-03-04 DIAGNOSIS — E782 Mixed hyperlipidemia: Secondary | ICD-10-CM | POA: Diagnosis not present

## 2019-03-04 DIAGNOSIS — E1169 Type 2 diabetes mellitus with other specified complication: Secondary | ICD-10-CM | POA: Diagnosis not present

## 2019-03-04 DIAGNOSIS — E1165 Type 2 diabetes mellitus with hyperglycemia: Secondary | ICD-10-CM | POA: Diagnosis not present

## 2019-03-04 DIAGNOSIS — I10 Essential (primary) hypertension: Secondary | ICD-10-CM | POA: Diagnosis not present

## 2019-03-08 DIAGNOSIS — M5430 Sciatica, unspecified side: Secondary | ICD-10-CM | POA: Diagnosis not present

## 2019-03-08 DIAGNOSIS — E1169 Type 2 diabetes mellitus with other specified complication: Secondary | ICD-10-CM | POA: Diagnosis not present

## 2019-03-08 DIAGNOSIS — I10 Essential (primary) hypertension: Secondary | ICD-10-CM | POA: Diagnosis not present

## 2019-03-08 DIAGNOSIS — R011 Cardiac murmur, unspecified: Secondary | ICD-10-CM | POA: Diagnosis not present

## 2019-03-08 DIAGNOSIS — E782 Mixed hyperlipidemia: Secondary | ICD-10-CM | POA: Diagnosis not present

## 2019-03-08 DIAGNOSIS — R945 Abnormal results of liver function studies: Secondary | ICD-10-CM | POA: Diagnosis not present

## 2019-03-08 DIAGNOSIS — M545 Low back pain: Secondary | ICD-10-CM | POA: Diagnosis not present

## 2019-03-08 DIAGNOSIS — M542 Cervicalgia: Secondary | ICD-10-CM | POA: Diagnosis not present

## 2019-05-04 ENCOUNTER — Ambulatory Visit (INDEPENDENT_AMBULATORY_CARE_PROVIDER_SITE_OTHER): Payer: Medicare HMO | Admitting: General Surgery

## 2019-05-04 ENCOUNTER — Encounter: Payer: Self-pay | Admitting: General Surgery

## 2019-05-04 ENCOUNTER — Other Ambulatory Visit: Payer: Self-pay

## 2019-05-04 VITALS — BP 139/69 | HR 92 | Temp 97.5°F | Resp 12 | Ht 74.0 in | Wt 274.0 lb

## 2019-05-04 DIAGNOSIS — M542 Cervicalgia: Secondary | ICD-10-CM | POA: Diagnosis not present

## 2019-05-04 NOTE — Patient Instructions (Signed)
Referral to Physical Therapy for Neck pain/ strain   [facility] [place] [phone] [address] [place], [place] [address]

## 2019-05-04 NOTE — Progress Notes (Signed)
[facility] Note   HPI:  79 y.o. Male presents to clinic for follow-up evaluation after he had a lipoma removed from his back this fall. He says that ever since he was laid on his side for that surgery that he has had bilateral neck pain going up into the occiput of his head. He has tried stretches and bengay type creams, but this has not resolved it.  He has had his wife massage it some.    Review of Systems:  No fever or chills No numbness or  Tingling in the arms No arm weakness Pain in the posterior neck bilaterally up into the base of the skull All other review of systems: otherwise negative   Vital Signs:  BP 139/69   Pulse 92   Temp (!) 97.5 F (36.4 C) (Oral)   Resp 12   Ht 6\' 2"  (1.88 m)   Wt 274 lb (124.3 kg)   SpO2 96%   BMI 35.18 kg/m    Physical Exam:  Physical Exam Vitals reviewed.  HENT:     Head: Normocephalic.     Comments: Tight muscles, trapezius in the posterior neck and shoulders, massage/ palpation improved this  Cardiovascular:     Rate and Rhythm: Normal rate.  Pulmonary:     Effort: Pulmonary effort is normal.  Musculoskeletal:        General: No swelling. Normal range of motion.     Comments: Moves all extremities and normal strength   Neurological:     General: No focal deficit present.     Mental Status: He is alert and oriented to person, place, and time.     Motor: No weakness.     Assessment:  79 y.o. yo Male with neck pain after his operation. I told him I cannot say if the positioning caused any muscle strain or accentuated some that he already had. I apologized for his suffering the past few months. Discussed with him the option of physical therapy as a way to look at stretches and exercises that could help. He was reluctant at first but then expressed understanding and willingness to see what they have to say. He is worried about the upfront cost.  [facility] should take his insurance the same as we do.    Plan:  - Physical therapy referral for neck pain and strain; and if opts to not do physical therapy,I told him he should try a massage therapist  - If this does not improve, then warned him he may have to have imaging and possible referral to a back spine specialist. We would coordinate this with Dr. 62.  - He will notify Margo Aye if issues or concerns   All of the above recommendations were discussed with the patient and patient's family, and all of patient's and family's questions were answered to their expressed satisfaction.  [place], MD [facility] [address] [place], [place] [phone] (office)

## 2019-05-06 ENCOUNTER — Telehealth (HOSPITAL_COMMUNITY): Payer: Self-pay | Admitting: Physical Therapy

## 2019-05-06 ENCOUNTER — Ambulatory Visit (HOSPITAL_COMMUNITY): Payer: Medicare HMO | Attending: General Surgery | Admitting: Physical Therapy

## 2019-05-06 ENCOUNTER — Encounter (HOSPITAL_COMMUNITY): Payer: Self-pay | Admitting: Physical Therapy

## 2019-05-06 ENCOUNTER — Other Ambulatory Visit: Payer: Self-pay

## 2019-05-06 DIAGNOSIS — R293 Abnormal posture: Secondary | ICD-10-CM | POA: Diagnosis not present

## 2019-05-06 DIAGNOSIS — M542 Cervicalgia: Secondary | ICD-10-CM | POA: Diagnosis not present

## 2019-05-06 NOTE — Therapy (Addendum)
[facility] [address] [place], [place], [address] Phone: [phone]   Fax:  [phone]  Physical Therapy Evaluation  Patient Details  Name: Jerome Gonzalez MRN: [id number] Date of Birth: 03/24/1940 Referring Provider (PT): Lindsay Bridges  PHYSICAL THERAPY DISCHARGE SUMMARY  Visits from Start of Care: 1  Current functional level related to goals / functional outcomes: See below   Remaining deficits: See below    Education / Equipment: See below  Pt called back and cancelled all appointments due to financial concerns.   Plan: Patient agrees to discharge.  Patient goals were not met. Patient is being discharged due to financial reasons.  ?????       Encounter Date: 05/06/2019  PT End of Session - 05/06/19 1537    Visit Number  1    Number of Visits  8    Date for PT Re-Evaluation  06/05/19    Authorization Type  [employer] authorization put in but not approved yet    Progress Note Due on Visit  8    PT Start Time  0830    PT Stop Time  0910    PT Time Calculation (min)  40 min       Past Medical History:  Diagnosis Date  . Cholelithiasis    Asymptomatic  . COPD (chronic obstructive pulmonary disease) (HCC)    Noted on chest CT done to f/u ? pulm nodule noted on CXR-(mild emphysema + asymptomatic gallstone: no pulm nodule.)  . Diabetes mellitus   . DOE (dyspnea on exertion) 11/2010   PFTs and EKG normal at prior PMD's office.  . Erectile dysfunction   . History of cluster headache 2012   Percocet aborted them  . Hypercholesterolemia   . Hypertension   . Insomnia    Nocturnal oxygen monitoring NORMAL 2012  . Rectal bleeding 04/2011   TCS planned but then decided on anusol supp and this helped significantly: GI agreed w/pt that it was ok to wait until 12/2012 to do his "regular" 10 yr recall for screening TCS.    Past Surgical History:  Procedure Laterality Date  . COLONOSCOPY  Nov 2004   RMR: friable anal canal,  left-sided diverticula.  Repeat with [facility] (Dr. Rourke) is planned for 2014  . CYST EXCISION N/A 11/25/2018   Procedure: EXCISION CYST 4CM  BACK;  Surgeon: Bridges, Lindsay C, MD;  Location: [facility];  Service: General;  Laterality: N/A;  . foot surgeries     X 3, as child    There were no vitals filed for this visit.   Subjective Assessment - 05/06/19 0912    Subjective  Pt states that he has been having pain on both sides of his neck ever since he went to have an infected cyst taken surgically off his back.  He states when they went to pull him over he felt increased pain and has had the pain ever since whenever he turns his neck.  He has been putting ointnment on his neck which helps the pain but does not get rid of it.    Pertinent History  OA    Limitations  Reading;Other (comment)    How long can you sit comfortably?  no problem    How long can you stand comfortably?  no problem    How long can you walk comfortably?  no problem    Patient Stated Goals  to be able to turn his head to see in his   blind side when driving and to have no pain.    Currently in Pain?  No/denies   pain when turning his head 7/10        [facility] PT Assessment - 05/06/19 0001      Assessment   Medical Diagnosis  cervical strain     Referring Provider (PT)  Curlene Labrum     Onset Date/Surgical Date  11/25/18    Prior Therapy  none      Precautions   Precautions  None      Restrictions   Weight Bearing Restrictions  No      Balance Screen   Has the patient fallen in the past 6 months  No    Has the patient had a decrease in activity level because of a fear of falling?   No    Is the patient reluctant to leave their home because of a fear of falling?   No      Cognition   Overall Cognitive Status  Within Functional Limits for tasks assessed      Observation/Other Assessments   Focus on Therapeutic Outcomes (FOTO)   limited 30%      Posture/Postural Control   Posture/Postural  Control  Postural limitations    Postural Limitations  Rounded Shoulders;Forward head;Decreased lumbar lordosis;Decreased thoracic kyphosis;Flexed trunk      ROM / Strength   AROM / PROM / Strength  AROM;Strength      AROM   AROM Assessment Site  Cervical    Cervical Flexion  40    Cervical Extension  50    Cervical - Right Side Bend  22    Cervical - Left Side Bend  34    Cervical - Right Rotation  48    Cervical - Left Rotation  35      Strength   Strength Assessment Site  Cervical    Cervical Extension  5/5    Cervical - Right Side Bend  5/5    Cervical - Left Side Bend  5/5      Palpation   Palpation comment  decreased jt motion, moderate spasm in B sternocleidomastoid mm                 Objective measurements completed on examination: See above findings.      [facility] Adult PT Treatment/Exercise - 05/06/19 0001      Exercises   Exercises  Neck      Neck Exercises: Supine   Neck Retraction  5 reps    Neck Retraction Limitations  scapular retraction x 5     Cervical Rotation  Both;5 reps      Manual Therapy   Manual Therapy  Joint mobilization;Soft tissue mobilization    Manual therapy comments  done seperate from all other aspects of treatment     Joint Mobilization  to improve ROM    Soft tissue mobilization  to decrease pain              PT Education - 05/06/19 1536    Education Details  postural awareness, HEp    Person(s) Educated  Patient    Methods  Explanation;Handout    Comprehension  Verbalized understanding;Returned demonstration       PT Short Term Goals - 05/06/19 1544      PT SHORT TERM GOAL #1   Title  PT to be I in Hep to allow pain to decrease to no greater than a 2/10 to assist in improved sleeping  Time  2    Period  Weeks    Status  New    Target Date  05/20/19      PT SHORT TERM GOAL #2   Title  PT cervical rotation to increase by 10 degrees bilaterally to allow safer driving .    Time  2    Period  Weeks     Status  New        PT Long Term Goals - 05/06/19 1546      PT LONG TERM GOAL #1   Title  PT to be I in advanced HEP to allow pain in cervical area to decrease to 0/10 to allow pt have a full night sleep.    Time  4    Period  Weeks    Status  New    Target Date  06/03/19      PT LONG TERM GOAL #2   Title  PT to be able to rotate his head 20 degrees more to both the right and left to allow pt to have conversations with individuals sitting next to him.    Time  4    Period  Weeks             Plan - 05/06/19 1538    Clinical Impression Statement  Mr. Ivey is a 78 yo male who has had increased cervical pain with rotation since 11/25/2018 when he had surgery to remove an infected sebaceous cyst on his back.  He feels that his neck was strained when he was being pulled over onto the gurney.  At this time he is not completing any exercises.  Evaluation demonstrates postural dysfunction, decreased ROM, increased pain and increased mm tension.  Mr. Arciniega will benefit from skilled PT to address these issues .    Personal Factors and Comorbidities  Behavior Pattern;Fitness;Time since onset of injury/illness/exacerbation    Examination-Activity Limitations  Other   driving   Examination-Participation Restrictions  Driving    Stability/Clinical Decision Making  Stable/Uncomplicated    Clinical Decision Making  Low    Rehab Potential  Good    PT Frequency  2x / week    PT Duration  4 weeks    PT Treatment/Interventions  Patient/family education;Manual techniques;Passive range of motion;Dry needling;Therapeutic exercise    PT Next Visit Plan  Begin cervical and thoracic excursions while in good sitting posture, sitting  scapular and cervical retraction continue with manual    PT Home Exercise Plan  supine cervical and scapular retraction, supine cervical rotation       Patient will benefit from skilled therapeutic intervention in order to improve the following deficits and impairments:   Pain, Postural dysfunction, Increased muscle spasms, Decreased range of motion  Visit Diagnosis: Cervicalgia  Abnormal posture     Problem List Patient Active Problem List   Diagnosis Date Noted  . Neck pain, bilateral 05/04/2019  . Infected sebaceous cyst 10/20/2018  . Type II or unspecified type diabetes mellitus without mention of complication, not stated as uncontrolled 10/22/2011  . HTN (hypertension), benign 10/22/2011  . Acute bronchitis 10/22/2011  . Hyperlipemia, mixed 10/22/2011  . Constipation 06/19/2011  . Hematochezia 04/30/2011   Cynthia Russell, PT CLT [phone] 05/06/2019, 3:53 PM  [facility] [address] [place], [place], [address] Phone: [phone]   Fax:  [phone]  Name: Jerome Gonzalez MRN: [id number] Date of Birth: 08/13/1940  

## 2019-05-06 NOTE — Telephone Encounter (Signed)
pt wants to cx all of his appt s because he stated that he cannot to pay the co-pay amt. i guess he wants to be discharged.

## 2019-05-11 ENCOUNTER — Encounter (HOSPITAL_COMMUNITY): Payer: Medicare HMO | Admitting: Physical Therapy

## 2019-05-13 ENCOUNTER — Encounter (HOSPITAL_COMMUNITY): Payer: Medicare HMO | Admitting: Physical Therapy

## 2019-05-19 ENCOUNTER — Encounter (HOSPITAL_COMMUNITY): Payer: Medicare HMO | Admitting: Physical Therapy

## 2019-05-21 ENCOUNTER — Encounter (HOSPITAL_COMMUNITY): Payer: Medicare HMO | Admitting: Physical Therapy

## 2019-05-25 ENCOUNTER — Encounter (HOSPITAL_COMMUNITY): Payer: Medicare HMO | Admitting: Physical Therapy

## 2019-05-27 ENCOUNTER — Encounter (HOSPITAL_COMMUNITY): Payer: Medicare HMO | Admitting: Physical Therapy

## 2019-06-01 ENCOUNTER — Encounter (HOSPITAL_COMMUNITY): Payer: Medicare HMO | Admitting: Physical Therapy

## 2019-06-03 ENCOUNTER — Encounter (HOSPITAL_COMMUNITY): Payer: Medicare HMO | Admitting: Physical Therapy

## 2019-06-08 DIAGNOSIS — Q8509 Other neurofibromatosis: Secondary | ICD-10-CM | POA: Diagnosis not present

## 2019-06-08 DIAGNOSIS — M545 Low back pain: Secondary | ICD-10-CM | POA: Diagnosis not present

## 2019-06-08 DIAGNOSIS — M542 Cervicalgia: Secondary | ICD-10-CM | POA: Diagnosis not present

## 2019-06-08 DIAGNOSIS — I10 Essential (primary) hypertension: Secondary | ICD-10-CM | POA: Diagnosis not present

## 2019-08-04 DIAGNOSIS — E1169 Type 2 diabetes mellitus with other specified complication: Secondary | ICD-10-CM | POA: Diagnosis not present

## 2019-08-04 DIAGNOSIS — E1165 Type 2 diabetes mellitus with hyperglycemia: Secondary | ICD-10-CM | POA: Diagnosis not present

## 2019-08-04 DIAGNOSIS — Z0000 Encounter for general adult medical examination without abnormal findings: Secondary | ICD-10-CM | POA: Diagnosis not present

## 2019-08-04 DIAGNOSIS — E6609 Other obesity due to excess calories: Secondary | ICD-10-CM | POA: Diagnosis not present

## 2019-08-04 DIAGNOSIS — E119 Type 2 diabetes mellitus without complications: Secondary | ICD-10-CM | POA: Diagnosis not present

## 2019-08-04 DIAGNOSIS — E875 Hyperkalemia: Secondary | ICD-10-CM | POA: Diagnosis not present

## 2019-08-04 DIAGNOSIS — E782 Mixed hyperlipidemia: Secondary | ICD-10-CM | POA: Diagnosis not present

## 2019-08-04 DIAGNOSIS — I10 Essential (primary) hypertension: Secondary | ICD-10-CM | POA: Diagnosis not present

## 2019-08-09 DIAGNOSIS — M545 Low back pain: Secondary | ICD-10-CM | POA: Diagnosis not present

## 2019-08-09 DIAGNOSIS — M542 Cervicalgia: Secondary | ICD-10-CM | POA: Diagnosis not present

## 2019-08-09 DIAGNOSIS — E782 Mixed hyperlipidemia: Secondary | ICD-10-CM | POA: Diagnosis not present

## 2019-08-09 DIAGNOSIS — M5430 Sciatica, unspecified side: Secondary | ICD-10-CM | POA: Diagnosis not present

## 2019-08-09 DIAGNOSIS — E1169 Type 2 diabetes mellitus with other specified complication: Secondary | ICD-10-CM | POA: Diagnosis not present

## 2019-08-09 DIAGNOSIS — R945 Abnormal results of liver function studies: Secondary | ICD-10-CM | POA: Diagnosis not present

## 2019-08-09 DIAGNOSIS — I10 Essential (primary) hypertension: Secondary | ICD-10-CM | POA: Diagnosis not present

## 2019-08-09 DIAGNOSIS — R011 Cardiac murmur, unspecified: Secondary | ICD-10-CM | POA: Diagnosis not present

## 2019-11-11 DIAGNOSIS — I10 Essential (primary) hypertension: Secondary | ICD-10-CM | POA: Diagnosis not present

## 2019-11-11 DIAGNOSIS — R42 Dizziness and giddiness: Secondary | ICD-10-CM | POA: Diagnosis not present

## 2019-11-11 DIAGNOSIS — R1010 Upper abdominal pain, unspecified: Secondary | ICD-10-CM | POA: Diagnosis not present

## 2019-11-11 DIAGNOSIS — M542 Cervicalgia: Secondary | ICD-10-CM | POA: Diagnosis not present

## 2020-02-08 DIAGNOSIS — E875 Hyperkalemia: Secondary | ICD-10-CM | POA: Diagnosis not present

## 2020-02-08 DIAGNOSIS — Z712 Person consulting for explanation of examination or test findings: Secondary | ICD-10-CM | POA: Diagnosis not present

## 2020-02-08 DIAGNOSIS — Q8509 Other neurofibromatosis: Secondary | ICD-10-CM | POA: Diagnosis not present

## 2020-02-08 DIAGNOSIS — Z0000 Encounter for general adult medical examination without abnormal findings: Secondary | ICD-10-CM | POA: Diagnosis not present

## 2020-02-08 DIAGNOSIS — R42 Dizziness and giddiness: Secondary | ICD-10-CM | POA: Diagnosis not present

## 2020-02-08 DIAGNOSIS — L723 Sebaceous cyst: Secondary | ICD-10-CM | POA: Diagnosis not present

## 2020-02-08 DIAGNOSIS — M542 Cervicalgia: Secondary | ICD-10-CM | POA: Diagnosis not present

## 2020-02-08 DIAGNOSIS — R1010 Upper abdominal pain, unspecified: Secondary | ICD-10-CM | POA: Diagnosis not present

## 2020-02-10 DIAGNOSIS — R945 Abnormal results of liver function studies: Secondary | ICD-10-CM | POA: Diagnosis not present

## 2020-02-10 DIAGNOSIS — I10 Essential (primary) hypertension: Secondary | ICD-10-CM | POA: Diagnosis not present

## 2020-02-10 DIAGNOSIS — L723 Sebaceous cyst: Secondary | ICD-10-CM | POA: Diagnosis not present

## 2020-02-10 DIAGNOSIS — M542 Cervicalgia: Secondary | ICD-10-CM | POA: Diagnosis not present

## 2020-02-10 DIAGNOSIS — Z0001 Encounter for general adult medical examination with abnormal findings: Secondary | ICD-10-CM | POA: Diagnosis not present

## 2020-02-10 DIAGNOSIS — R011 Cardiac murmur, unspecified: Secondary | ICD-10-CM | POA: Diagnosis not present

## 2020-02-10 DIAGNOSIS — M5430 Sciatica, unspecified side: Secondary | ICD-10-CM | POA: Diagnosis not present

## 2020-02-10 DIAGNOSIS — E1169 Type 2 diabetes mellitus with other specified complication: Secondary | ICD-10-CM | POA: Diagnosis not present

## 2020-02-10 DIAGNOSIS — E782 Mixed hyperlipidemia: Secondary | ICD-10-CM | POA: Diagnosis not present

## 2020-02-16 DIAGNOSIS — Z23 Encounter for immunization: Secondary | ICD-10-CM | POA: Diagnosis not present

## 2020-02-21 IMAGING — DX CERVICAL SPINE - 2-3 VIEW
4 series · 4 of 4 positions shown · non-contrast
Comparison: None

CLINICAL DATA: BILATERAL neck pain radiating to RIGHT shoulder
sometimes, no known injury

EXAM:
CERVICAL SPINE - 2-3 VIEW

[c-spine lat]
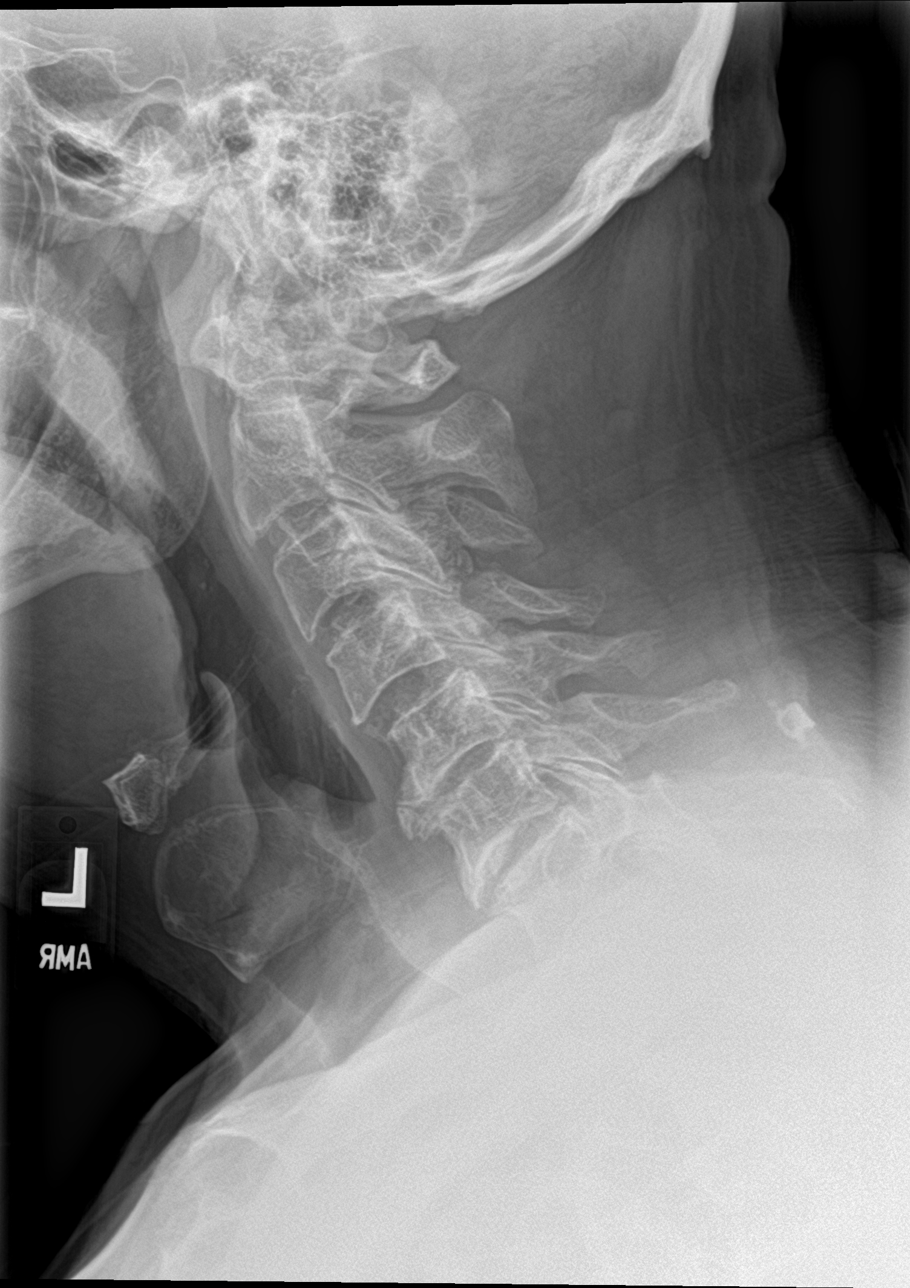

[c-spine ap]
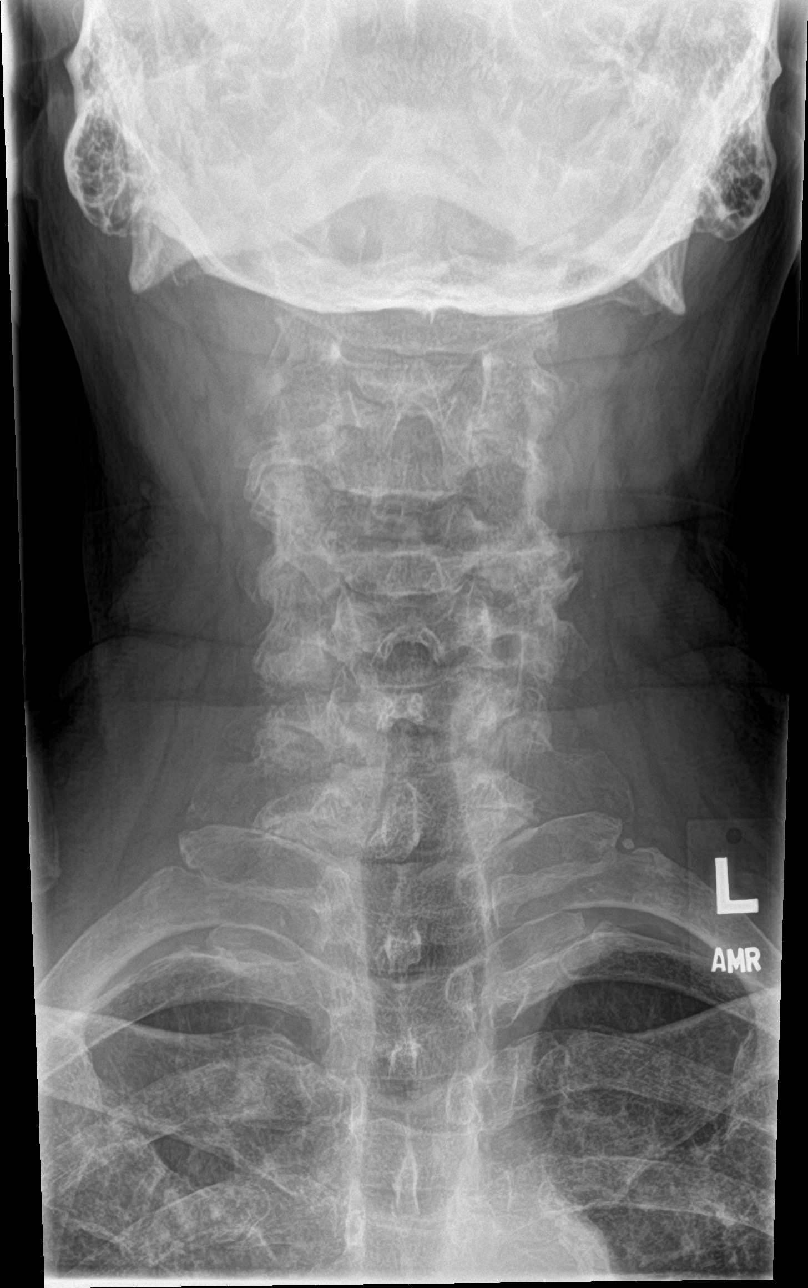

[c-spine open mouth]
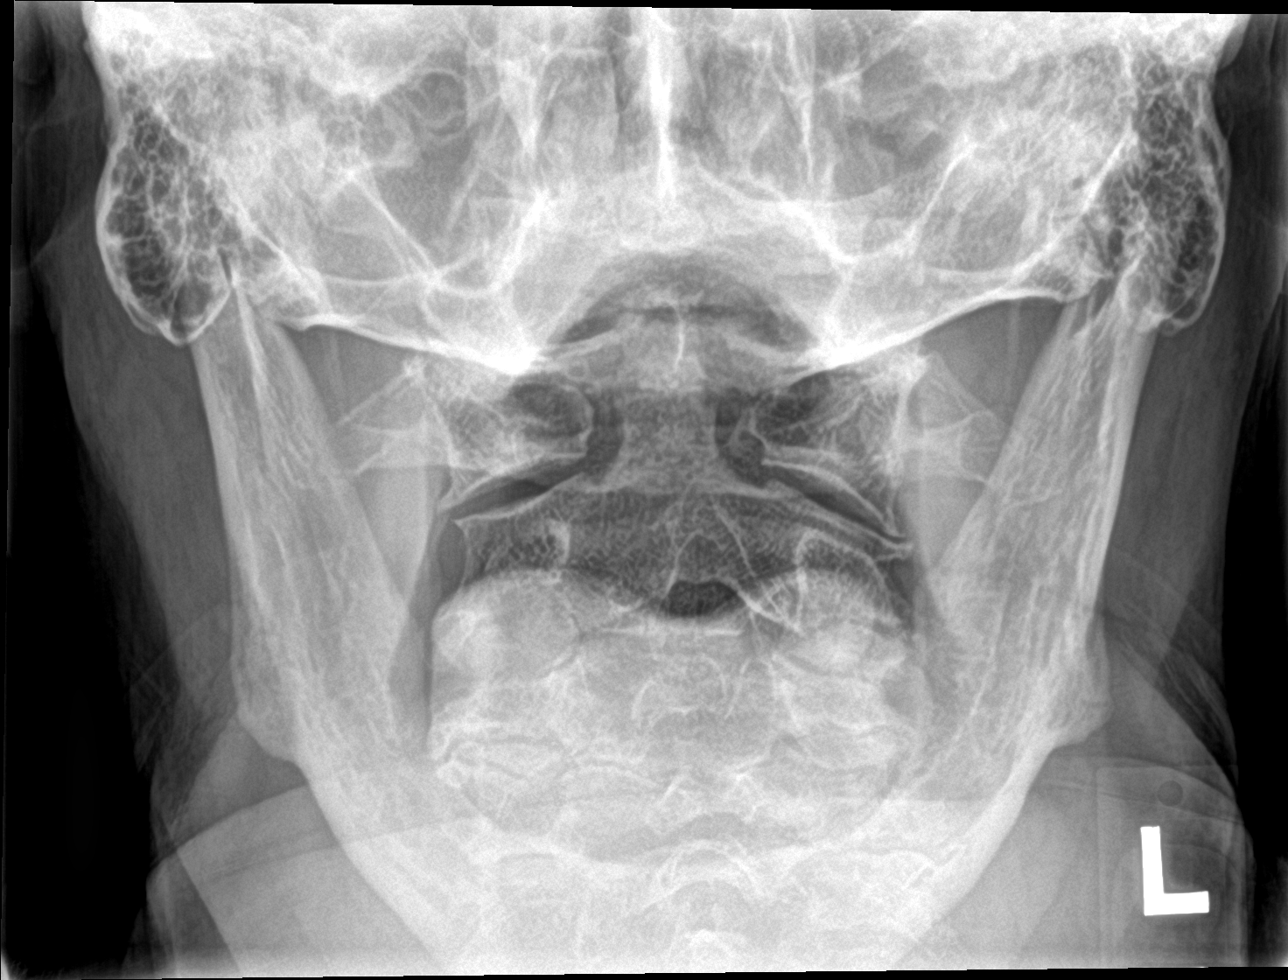

[c-spine swimmers trauma]
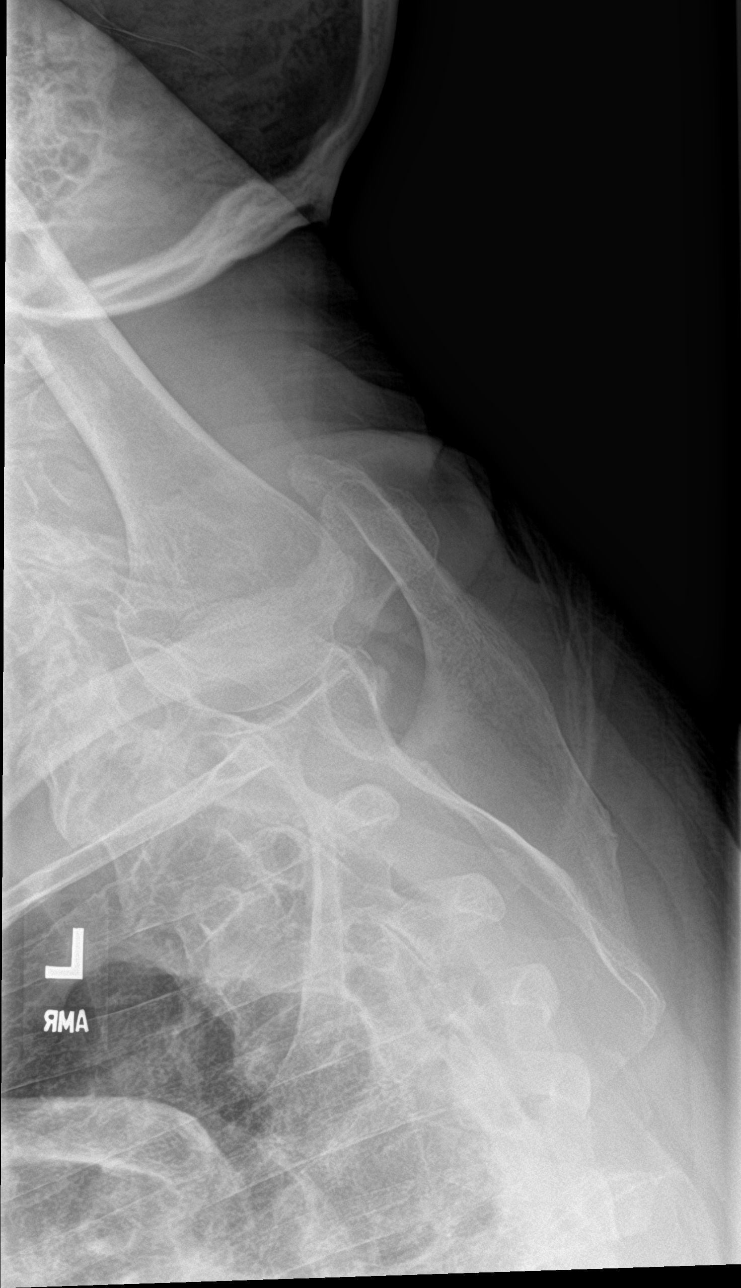

[4 of 4 positions shown; findings below may reference images not displayed]

FINDINGS: Prevertebral soft tissues normal thickness.

Vertebral body heights maintained.

Disc space narrowing and endplate spur formation at C5-C6 and C6-C7.

Multilevel facet degenerative changes.

Minimal anterolisthesis C4-C5.

No fracture, additional subluxation or bone destruction.

No oblique views obtained.

Lung apices clear.
IMPRESSION: Degenerative disc and facet disease changes of the cervical spine as
above.

No acute abnormalities.

## 2020-02-21 IMAGING — DX LUMBAR SPINE - 2-3 VIEW
3 series · 3 of 3 positions shown · non-contrast
Comparison: None

CLINICAL DATA: Low back pain with RIGHT-sided sciatica, no known
injury

EXAM:
LUMBAR SPINE - 2-3 VIEW

[l-spine lat (1 of 2)]
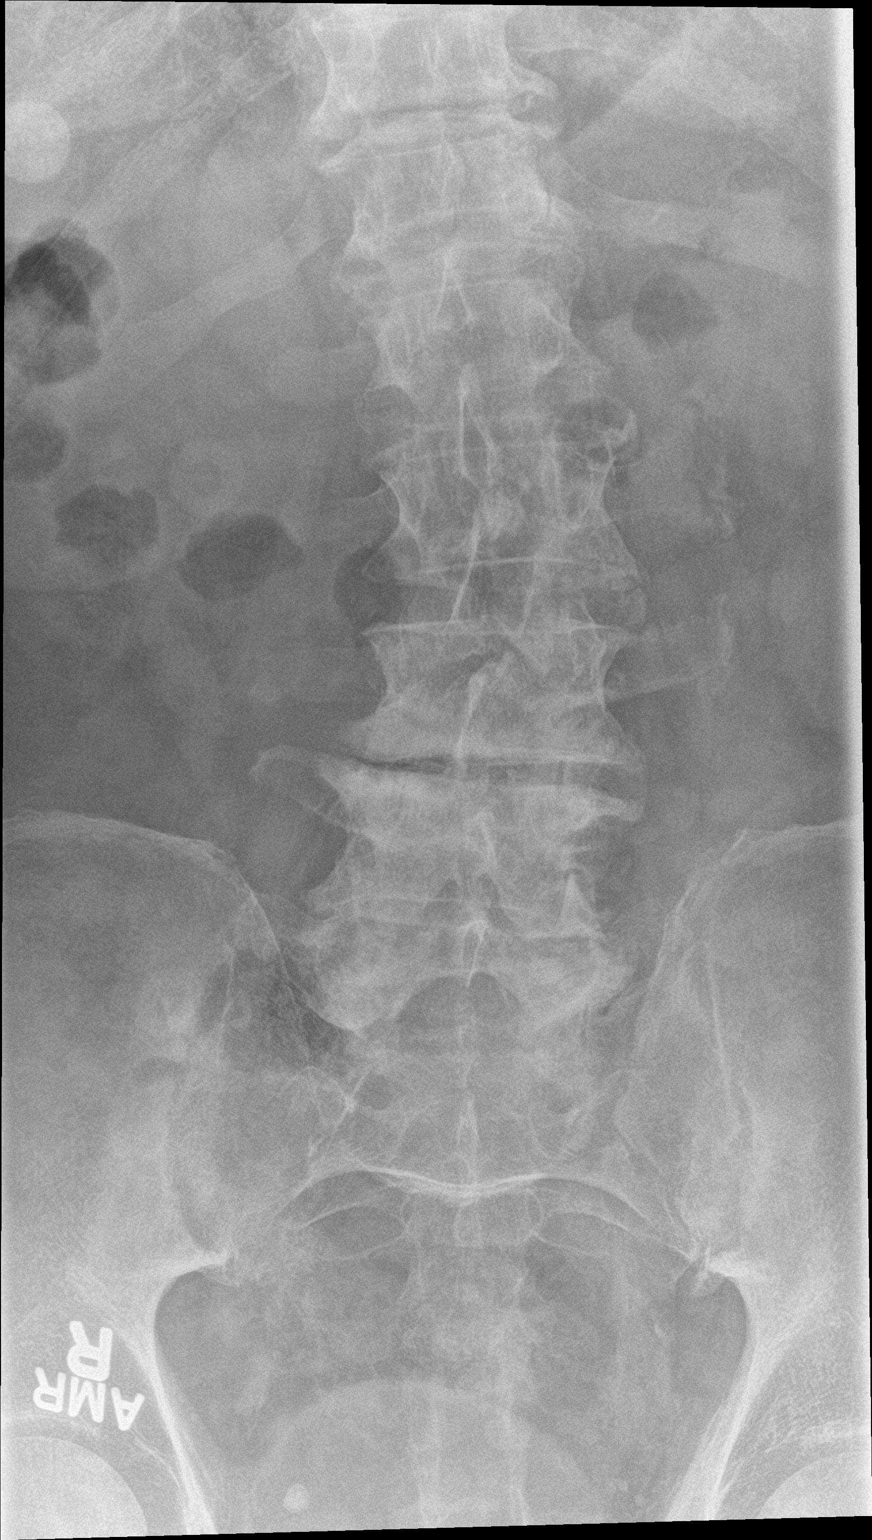

[l-spine spot]
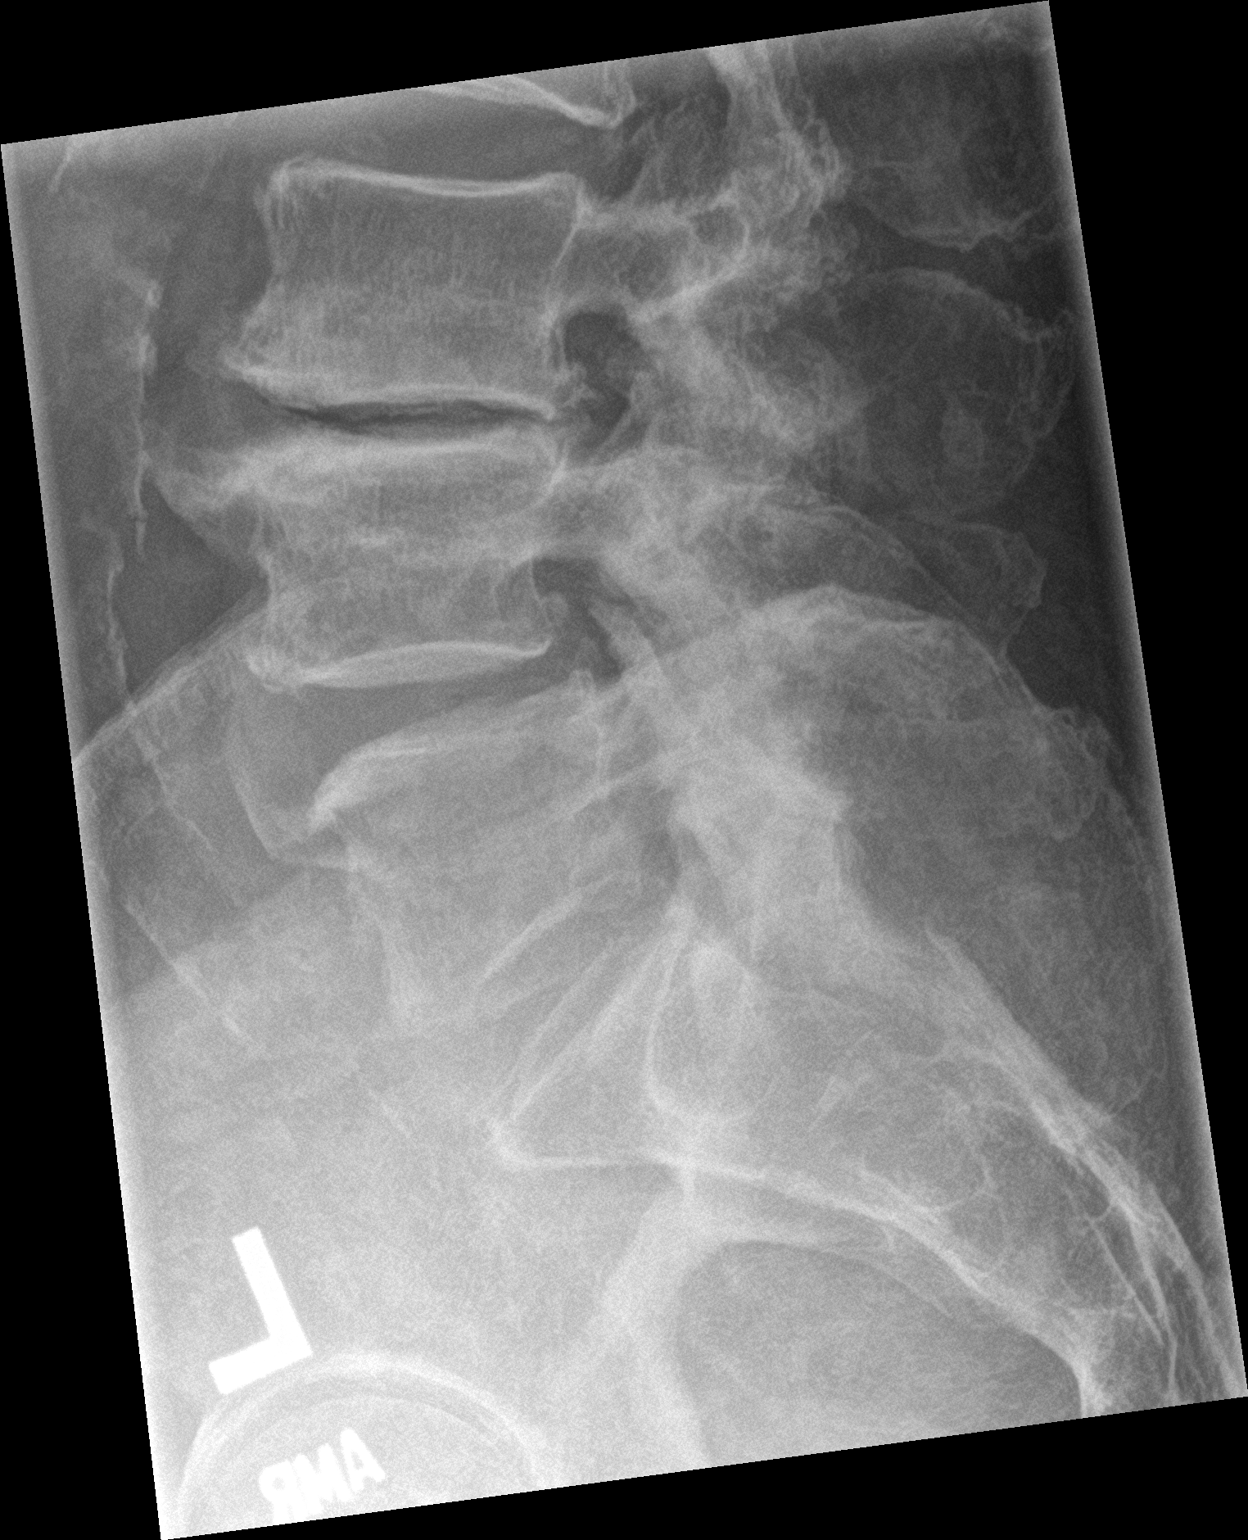

[l-spine lat (2 of 2)]
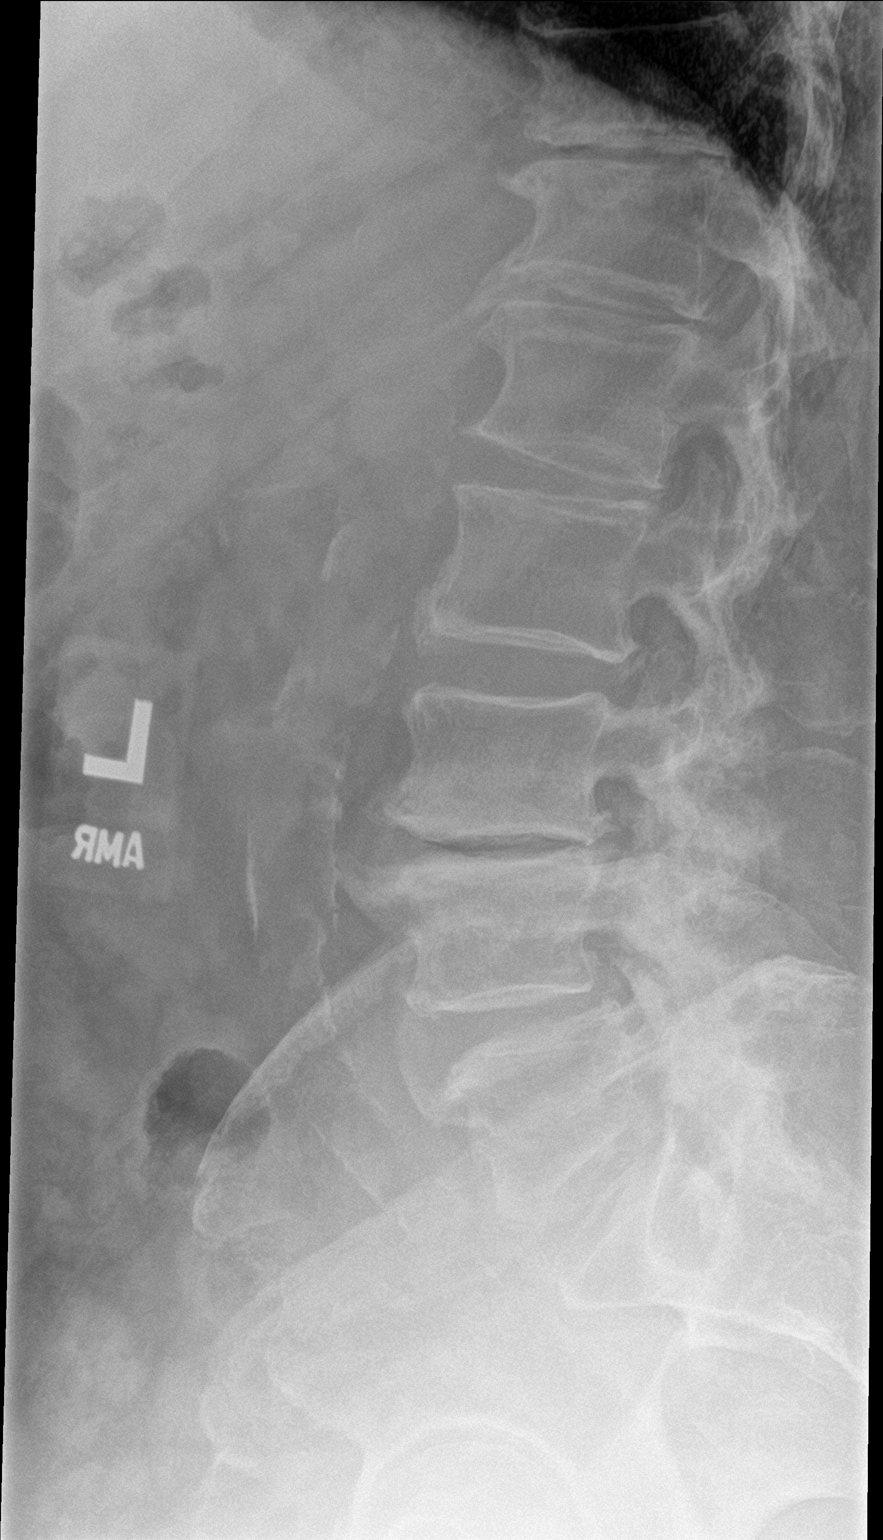

[3 of 3 positions shown; findings below may reference images not displayed]

FINDINGS: 5 non-rib-bearing lumbar vertebra.

Levoconvex lumbar scoliosis.

Disc space narrowing and endplate spur formation at L3-L4, less at
L5-S1 and at lower thoracic spine.

Vertebral body heights maintained without fracture or bone
destruction.

Minimal anterolisthesis L4-L5.

SI joints preserved.

Atherosclerotic calcifications aorta and iliac arteries.

Rounded opacity RIGHT upper quadrant with rounded central density,
suspect EKG lead.
IMPRESSION: Degenerative disc and facet disease changes of the lumbar spine with
mild levoconvex lumbar scoliosis as above.

## 2020-03-01 DIAGNOSIS — E119 Type 2 diabetes mellitus without complications: Secondary | ICD-10-CM | POA: Diagnosis not present

## 2020-05-22 DIAGNOSIS — E875 Hyperkalemia: Secondary | ICD-10-CM | POA: Diagnosis not present

## 2020-05-22 DIAGNOSIS — Q8509 Other neurofibromatosis: Secondary | ICD-10-CM | POA: Diagnosis not present

## 2020-05-22 DIAGNOSIS — E119 Type 2 diabetes mellitus without complications: Secondary | ICD-10-CM | POA: Diagnosis not present

## 2020-05-22 DIAGNOSIS — R945 Abnormal results of liver function studies: Secondary | ICD-10-CM | POA: Diagnosis not present

## 2020-05-22 DIAGNOSIS — E782 Mixed hyperlipidemia: Secondary | ICD-10-CM | POA: Diagnosis not present

## 2020-05-22 DIAGNOSIS — I872 Venous insufficiency (chronic) (peripheral): Secondary | ICD-10-CM | POA: Diagnosis not present

## 2020-05-22 DIAGNOSIS — E1165 Type 2 diabetes mellitus with hyperglycemia: Secondary | ICD-10-CM | POA: Diagnosis not present

## 2020-05-22 DIAGNOSIS — I10 Essential (primary) hypertension: Secondary | ICD-10-CM | POA: Diagnosis not present

## 2020-05-24 DIAGNOSIS — R011 Cardiac murmur, unspecified: Secondary | ICD-10-CM | POA: Diagnosis not present

## 2020-05-24 DIAGNOSIS — E875 Hyperkalemia: Secondary | ICD-10-CM | POA: Diagnosis not present

## 2020-05-24 DIAGNOSIS — E1169 Type 2 diabetes mellitus with other specified complication: Secondary | ICD-10-CM | POA: Diagnosis not present

## 2020-05-24 DIAGNOSIS — M5430 Sciatica, unspecified side: Secondary | ICD-10-CM | POA: Diagnosis not present

## 2020-05-24 DIAGNOSIS — M542 Cervicalgia: Secondary | ICD-10-CM | POA: Diagnosis not present

## 2020-05-24 DIAGNOSIS — E782 Mixed hyperlipidemia: Secondary | ICD-10-CM | POA: Diagnosis not present

## 2020-05-24 DIAGNOSIS — I10 Essential (primary) hypertension: Secondary | ICD-10-CM | POA: Diagnosis not present

## 2020-05-24 DIAGNOSIS — R945 Abnormal results of liver function studies: Secondary | ICD-10-CM | POA: Diagnosis not present

## 2020-05-24 DIAGNOSIS — L723 Sebaceous cyst: Secondary | ICD-10-CM | POA: Diagnosis not present

## 2020-08-23 DIAGNOSIS — M5441 Lumbago with sciatica, right side: Secondary | ICD-10-CM | POA: Diagnosis not present

## 2020-08-23 DIAGNOSIS — E782 Mixed hyperlipidemia: Secondary | ICD-10-CM | POA: Diagnosis not present

## 2020-08-23 DIAGNOSIS — E1169 Type 2 diabetes mellitus with other specified complication: Secondary | ICD-10-CM | POA: Diagnosis not present

## 2020-08-23 DIAGNOSIS — R011 Cardiac murmur, unspecified: Secondary | ICD-10-CM | POA: Diagnosis not present

## 2020-08-23 DIAGNOSIS — M5442 Lumbago with sciatica, left side: Secondary | ICD-10-CM | POA: Diagnosis not present

## 2020-08-23 DIAGNOSIS — E875 Hyperkalemia: Secondary | ICD-10-CM | POA: Diagnosis not present

## 2020-08-23 DIAGNOSIS — M542 Cervicalgia: Secondary | ICD-10-CM | POA: Diagnosis not present

## 2020-08-23 DIAGNOSIS — R945 Abnormal results of liver function studies: Secondary | ICD-10-CM | POA: Diagnosis not present

## 2020-11-28 DIAGNOSIS — E1169 Type 2 diabetes mellitus with other specified complication: Secondary | ICD-10-CM | POA: Diagnosis not present

## 2020-11-28 DIAGNOSIS — Z125 Encounter for screening for malignant neoplasm of prostate: Secondary | ICD-10-CM | POA: Diagnosis not present

## 2020-11-28 DIAGNOSIS — E782 Mixed hyperlipidemia: Secondary | ICD-10-CM | POA: Diagnosis not present

## 2020-12-06 DIAGNOSIS — M5442 Lumbago with sciatica, left side: Secondary | ICD-10-CM | POA: Diagnosis not present

## 2020-12-06 DIAGNOSIS — E1169 Type 2 diabetes mellitus with other specified complication: Secondary | ICD-10-CM | POA: Diagnosis not present

## 2020-12-06 DIAGNOSIS — R011 Cardiac murmur, unspecified: Secondary | ICD-10-CM | POA: Diagnosis not present

## 2020-12-06 DIAGNOSIS — M542 Cervicalgia: Secondary | ICD-10-CM | POA: Diagnosis not present

## 2020-12-06 DIAGNOSIS — E875 Hyperkalemia: Secondary | ICD-10-CM | POA: Diagnosis not present

## 2020-12-06 DIAGNOSIS — M5441 Lumbago with sciatica, right side: Secondary | ICD-10-CM | POA: Diagnosis not present

## 2020-12-06 DIAGNOSIS — E782 Mixed hyperlipidemia: Secondary | ICD-10-CM | POA: Diagnosis not present

## 2020-12-06 DIAGNOSIS — R945 Abnormal results of liver function studies: Secondary | ICD-10-CM | POA: Diagnosis not present

## 2020-12-28 DIAGNOSIS — Z23 Encounter for immunization: Secondary | ICD-10-CM | POA: Diagnosis not present

## 2021-03-21 DIAGNOSIS — M25532 Pain in left wrist: Secondary | ICD-10-CM | POA: Diagnosis not present

## 2021-05-04 DIAGNOSIS — M25532 Pain in left wrist: Secondary | ICD-10-CM | POA: Diagnosis not present

## 2021-06-01 DIAGNOSIS — E782 Mixed hyperlipidemia: Secondary | ICD-10-CM | POA: Diagnosis not present

## 2021-06-01 DIAGNOSIS — E1169 Type 2 diabetes mellitus with other specified complication: Secondary | ICD-10-CM | POA: Diagnosis not present

## 2021-06-06 DIAGNOSIS — M5442 Lumbago with sciatica, left side: Secondary | ICD-10-CM | POA: Diagnosis not present

## 2021-06-06 DIAGNOSIS — R011 Cardiac murmur, unspecified: Secondary | ICD-10-CM | POA: Diagnosis not present

## 2021-06-06 DIAGNOSIS — E782 Mixed hyperlipidemia: Secondary | ICD-10-CM | POA: Diagnosis not present

## 2021-06-06 DIAGNOSIS — R945 Abnormal results of liver function studies: Secondary | ICD-10-CM | POA: Diagnosis not present

## 2021-06-06 DIAGNOSIS — E1169 Type 2 diabetes mellitus with other specified complication: Secondary | ICD-10-CM | POA: Diagnosis not present

## 2021-06-06 DIAGNOSIS — M5441 Lumbago with sciatica, right side: Secondary | ICD-10-CM | POA: Diagnosis not present

## 2021-06-06 DIAGNOSIS — M542 Cervicalgia: Secondary | ICD-10-CM | POA: Diagnosis not present

## 2021-06-06 DIAGNOSIS — E875 Hyperkalemia: Secondary | ICD-10-CM | POA: Diagnosis not present

## 2021-06-27 DIAGNOSIS — H52203 Unspecified astigmatism, bilateral: Secondary | ICD-10-CM | POA: Diagnosis not present

## 2021-11-21 DIAGNOSIS — Z23 Encounter for immunization: Secondary | ICD-10-CM | POA: Diagnosis not present

## 2021-11-29 DIAGNOSIS — Z125 Encounter for screening for malignant neoplasm of prostate: Secondary | ICD-10-CM | POA: Diagnosis not present

## 2021-11-29 DIAGNOSIS — E782 Mixed hyperlipidemia: Secondary | ICD-10-CM | POA: Diagnosis not present

## 2021-11-29 DIAGNOSIS — E1169 Type 2 diabetes mellitus with other specified complication: Secondary | ICD-10-CM | POA: Diagnosis not present

## 2021-12-06 DIAGNOSIS — R011 Cardiac murmur, unspecified: Secondary | ICD-10-CM | POA: Diagnosis not present

## 2021-12-06 DIAGNOSIS — E1169 Type 2 diabetes mellitus with other specified complication: Secondary | ICD-10-CM | POA: Diagnosis not present

## 2021-12-06 DIAGNOSIS — R945 Abnormal results of liver function studies: Secondary | ICD-10-CM | POA: Diagnosis not present

## 2021-12-06 DIAGNOSIS — M542 Cervicalgia: Secondary | ICD-10-CM | POA: Diagnosis not present

## 2021-12-06 DIAGNOSIS — E782 Mixed hyperlipidemia: Secondary | ICD-10-CM | POA: Diagnosis not present

## 2021-12-06 DIAGNOSIS — M5441 Lumbago with sciatica, right side: Secondary | ICD-10-CM | POA: Diagnosis not present

## 2021-12-06 DIAGNOSIS — E875 Hyperkalemia: Secondary | ICD-10-CM | POA: Diagnosis not present

## 2021-12-06 DIAGNOSIS — M5442 Lumbago with sciatica, left side: Secondary | ICD-10-CM | POA: Diagnosis not present

## 2022-02-20 DIAGNOSIS — Z961 Presence of intraocular lens: Secondary | ICD-10-CM | POA: Diagnosis not present

## 2022-02-20 DIAGNOSIS — H18513 Endothelial corneal dystrophy, bilateral: Secondary | ICD-10-CM | POA: Diagnosis not present

## 2022-04-04 DIAGNOSIS — Z006 Encounter for examination for normal comparison and control in clinical research program: Secondary | ICD-10-CM | POA: Diagnosis not present

## 2022-04-04 DIAGNOSIS — H1820 Unspecified corneal edema: Secondary | ICD-10-CM | POA: Diagnosis not present

## 2022-05-31 DIAGNOSIS — E782 Mixed hyperlipidemia: Secondary | ICD-10-CM | POA: Diagnosis not present

## 2022-05-31 DIAGNOSIS — E1169 Type 2 diabetes mellitus with other specified complication: Secondary | ICD-10-CM | POA: Diagnosis not present

## 2022-06-04 DIAGNOSIS — Z0000 Encounter for general adult medical examination without abnormal findings: Secondary | ICD-10-CM | POA: Diagnosis not present

## 2022-06-05 DIAGNOSIS — E875 Hyperkalemia: Secondary | ICD-10-CM | POA: Diagnosis not present

## 2022-06-05 DIAGNOSIS — R945 Abnormal results of liver function studies: Secondary | ICD-10-CM | POA: Diagnosis not present

## 2022-06-05 DIAGNOSIS — M5442 Lumbago with sciatica, left side: Secondary | ICD-10-CM | POA: Diagnosis not present

## 2022-06-05 DIAGNOSIS — M542 Cervicalgia: Secondary | ICD-10-CM | POA: Diagnosis not present

## 2022-06-05 DIAGNOSIS — E1169 Type 2 diabetes mellitus with other specified complication: Secondary | ICD-10-CM | POA: Diagnosis not present

## 2022-06-05 DIAGNOSIS — I10 Essential (primary) hypertension: Secondary | ICD-10-CM | POA: Diagnosis not present

## 2022-06-05 DIAGNOSIS — M5441 Lumbago with sciatica, right side: Secondary | ICD-10-CM | POA: Diagnosis not present

## 2022-06-05 DIAGNOSIS — E782 Mixed hyperlipidemia: Secondary | ICD-10-CM | POA: Diagnosis not present

## 2022-07-04 DIAGNOSIS — H18511 Endothelial corneal dystrophy, right eye: Secondary | ICD-10-CM | POA: Diagnosis not present

## 2022-07-11 DIAGNOSIS — H1811 Bullous keratopathy, right eye: Secondary | ICD-10-CM | POA: Diagnosis not present

## 2022-07-11 DIAGNOSIS — E1136 Type 2 diabetes mellitus with diabetic cataract: Secondary | ICD-10-CM | POA: Diagnosis not present

## 2022-07-12 DIAGNOSIS — Z947 Corneal transplant status: Secondary | ICD-10-CM | POA: Diagnosis not present

## 2022-07-12 DIAGNOSIS — Z961 Presence of intraocular lens: Secondary | ICD-10-CM | POA: Diagnosis not present

## 2022-07-12 DIAGNOSIS — H18512 Endothelial corneal dystrophy, left eye: Secondary | ICD-10-CM | POA: Diagnosis not present

## 2022-07-17 DIAGNOSIS — H18512 Endothelial corneal dystrophy, left eye: Secondary | ICD-10-CM | POA: Diagnosis not present

## 2022-07-17 DIAGNOSIS — Z947 Corneal transplant status: Secondary | ICD-10-CM | POA: Diagnosis not present

## 2022-07-17 DIAGNOSIS — Z961 Presence of intraocular lens: Secondary | ICD-10-CM | POA: Diagnosis not present

## 2022-07-31 DIAGNOSIS — Z961 Presence of intraocular lens: Secondary | ICD-10-CM | POA: Diagnosis not present

## 2022-07-31 DIAGNOSIS — H18512 Endothelial corneal dystrophy, left eye: Secondary | ICD-10-CM | POA: Diagnosis not present

## 2022-07-31 DIAGNOSIS — Z947 Corneal transplant status: Secondary | ICD-10-CM | POA: Diagnosis not present

## 2022-08-23 DIAGNOSIS — Z961 Presence of intraocular lens: Secondary | ICD-10-CM | POA: Diagnosis not present

## 2022-08-23 DIAGNOSIS — H18512 Endothelial corneal dystrophy, left eye: Secondary | ICD-10-CM | POA: Diagnosis not present

## 2022-08-23 DIAGNOSIS — E119 Type 2 diabetes mellitus without complications: Secondary | ICD-10-CM | POA: Diagnosis not present

## 2022-08-23 DIAGNOSIS — Z947 Corneal transplant status: Secondary | ICD-10-CM | POA: Diagnosis not present

## 2022-08-23 DIAGNOSIS — Z7984 Long term (current) use of oral hypoglycemic drugs: Secondary | ICD-10-CM | POA: Diagnosis not present

## 2022-09-04 DIAGNOSIS — I10 Essential (primary) hypertension: Secondary | ICD-10-CM | POA: Diagnosis not present

## 2022-09-04 DIAGNOSIS — M5441 Lumbago with sciatica, right side: Secondary | ICD-10-CM | POA: Diagnosis not present

## 2022-09-04 DIAGNOSIS — Z6832 Body mass index (BMI) 32.0-32.9, adult: Secondary | ICD-10-CM | POA: Diagnosis not present

## 2022-09-04 DIAGNOSIS — M5442 Lumbago with sciatica, left side: Secondary | ICD-10-CM | POA: Diagnosis not present

## 2022-10-07 DIAGNOSIS — H524 Presbyopia: Secondary | ICD-10-CM | POA: Diagnosis not present

## 2022-10-07 DIAGNOSIS — H18512 Endothelial corneal dystrophy, left eye: Secondary | ICD-10-CM | POA: Diagnosis not present

## 2022-10-07 DIAGNOSIS — H52223 Regular astigmatism, bilateral: Secondary | ICD-10-CM | POA: Diagnosis not present

## 2022-10-07 DIAGNOSIS — Z947 Corneal transplant status: Secondary | ICD-10-CM | POA: Diagnosis not present

## 2022-10-07 DIAGNOSIS — Z961 Presence of intraocular lens: Secondary | ICD-10-CM | POA: Diagnosis not present

## 2022-10-07 DIAGNOSIS — H18513 Endothelial corneal dystrophy, bilateral: Secondary | ICD-10-CM | POA: Diagnosis not present

## 2022-11-27 DIAGNOSIS — E1169 Type 2 diabetes mellitus with other specified complication: Secondary | ICD-10-CM | POA: Diagnosis not present

## 2022-11-27 DIAGNOSIS — Z125 Encounter for screening for malignant neoplasm of prostate: Secondary | ICD-10-CM | POA: Diagnosis not present

## 2022-11-27 DIAGNOSIS — E782 Mixed hyperlipidemia: Secondary | ICD-10-CM | POA: Diagnosis not present

## 2022-11-27 DIAGNOSIS — Z23 Encounter for immunization: Secondary | ICD-10-CM | POA: Diagnosis not present

## 2022-12-05 DIAGNOSIS — I10 Essential (primary) hypertension: Secondary | ICD-10-CM | POA: Diagnosis not present

## 2022-12-05 DIAGNOSIS — R945 Abnormal results of liver function studies: Secondary | ICD-10-CM | POA: Diagnosis not present

## 2022-12-05 DIAGNOSIS — E1169 Type 2 diabetes mellitus with other specified complication: Secondary | ICD-10-CM | POA: Diagnosis not present

## 2022-12-05 DIAGNOSIS — M5441 Lumbago with sciatica, right side: Secondary | ICD-10-CM | POA: Diagnosis not present

## 2022-12-05 DIAGNOSIS — E782 Mixed hyperlipidemia: Secondary | ICD-10-CM | POA: Diagnosis not present

## 2022-12-05 DIAGNOSIS — E875 Hyperkalemia: Secondary | ICD-10-CM | POA: Diagnosis not present

## 2022-12-05 DIAGNOSIS — M5414 Radiculopathy, thoracic region: Secondary | ICD-10-CM | POA: Diagnosis not present

## 2022-12-05 DIAGNOSIS — M5442 Lumbago with sciatica, left side: Secondary | ICD-10-CM | POA: Diagnosis not present

## 2022-12-11 DIAGNOSIS — H18513 Endothelial corneal dystrophy, bilateral: Secondary | ICD-10-CM | POA: Diagnosis not present

## 2022-12-11 DIAGNOSIS — H18512 Endothelial corneal dystrophy, left eye: Secondary | ICD-10-CM | POA: Diagnosis not present

## 2022-12-11 DIAGNOSIS — Z961 Presence of intraocular lens: Secondary | ICD-10-CM | POA: Diagnosis not present

## 2022-12-11 DIAGNOSIS — Z947 Corneal transplant status: Secondary | ICD-10-CM | POA: Diagnosis not present

## 2023-01-22 DIAGNOSIS — Z947 Corneal transplant status: Secondary | ICD-10-CM | POA: Diagnosis not present

## 2023-01-22 DIAGNOSIS — Z961 Presence of intraocular lens: Secondary | ICD-10-CM | POA: Diagnosis not present

## 2023-01-22 DIAGNOSIS — E119 Type 2 diabetes mellitus without complications: Secondary | ICD-10-CM | POA: Diagnosis not present

## 2023-01-22 DIAGNOSIS — H18512 Endothelial corneal dystrophy, left eye: Secondary | ICD-10-CM | POA: Diagnosis not present

## 2023-01-22 DIAGNOSIS — Z7984 Long term (current) use of oral hypoglycemic drugs: Secondary | ICD-10-CM | POA: Diagnosis not present

## 2023-04-30 DIAGNOSIS — E119 Type 2 diabetes mellitus without complications: Secondary | ICD-10-CM | POA: Diagnosis not present

## 2023-04-30 DIAGNOSIS — Z961 Presence of intraocular lens: Secondary | ICD-10-CM | POA: Diagnosis not present

## 2023-04-30 DIAGNOSIS — Z947 Corneal transplant status: Secondary | ICD-10-CM | POA: Diagnosis not present

## 2023-04-30 DIAGNOSIS — H18513 Endothelial corneal dystrophy, bilateral: Secondary | ICD-10-CM | POA: Diagnosis not present

## 2023-04-30 DIAGNOSIS — Z7984 Long term (current) use of oral hypoglycemic drugs: Secondary | ICD-10-CM | POA: Diagnosis not present

## 2023-05-27 DIAGNOSIS — E1169 Type 2 diabetes mellitus with other specified complication: Secondary | ICD-10-CM | POA: Diagnosis not present

## 2023-05-27 DIAGNOSIS — E782 Mixed hyperlipidemia: Secondary | ICD-10-CM | POA: Diagnosis not present

## 2023-06-05 DIAGNOSIS — M5442 Lumbago with sciatica, left side: Secondary | ICD-10-CM | POA: Diagnosis not present

## 2023-06-05 DIAGNOSIS — E875 Hyperkalemia: Secondary | ICD-10-CM | POA: Diagnosis not present

## 2023-06-05 DIAGNOSIS — E782 Mixed hyperlipidemia: Secondary | ICD-10-CM | POA: Diagnosis not present

## 2023-06-05 DIAGNOSIS — I10 Essential (primary) hypertension: Secondary | ICD-10-CM | POA: Diagnosis not present

## 2023-06-05 DIAGNOSIS — R011 Cardiac murmur, unspecified: Secondary | ICD-10-CM | POA: Diagnosis not present

## 2023-06-05 DIAGNOSIS — M542 Cervicalgia: Secondary | ICD-10-CM | POA: Diagnosis not present

## 2023-06-05 DIAGNOSIS — M5441 Lumbago with sciatica, right side: Secondary | ICD-10-CM | POA: Diagnosis not present

## 2023-06-05 DIAGNOSIS — R7401 Elevation of levels of liver transaminase levels: Secondary | ICD-10-CM | POA: Diagnosis not present

## 2023-06-05 DIAGNOSIS — E1169 Type 2 diabetes mellitus with other specified complication: Secondary | ICD-10-CM | POA: Diagnosis not present

## 2023-06-05 DIAGNOSIS — D3610 Benign neoplasm of peripheral nerves and autonomic nervous system, unspecified: Secondary | ICD-10-CM | POA: Diagnosis not present

## 2023-06-05 DIAGNOSIS — E1159 Type 2 diabetes mellitus with other circulatory complications: Secondary | ICD-10-CM | POA: Diagnosis not present

## 2023-06-05 DIAGNOSIS — K5903 Drug induced constipation: Secondary | ICD-10-CM | POA: Diagnosis not present

## 2023-09-04 DIAGNOSIS — Z6831 Body mass index (BMI) 31.0-31.9, adult: Secondary | ICD-10-CM | POA: Diagnosis not present

## 2023-09-04 DIAGNOSIS — G8929 Other chronic pain: Secondary | ICD-10-CM | POA: Diagnosis not present

## 2023-09-04 DIAGNOSIS — Z79891 Long term (current) use of opiate analgesic: Secondary | ICD-10-CM | POA: Diagnosis not present

## 2023-09-04 DIAGNOSIS — M542 Cervicalgia: Secondary | ICD-10-CM | POA: Diagnosis not present

## 2023-09-04 DIAGNOSIS — E669 Obesity, unspecified: Secondary | ICD-10-CM | POA: Diagnosis not present

## 2023-09-04 DIAGNOSIS — Z87891 Personal history of nicotine dependence: Secondary | ICD-10-CM | POA: Diagnosis not present

## 2023-09-04 DIAGNOSIS — Z79899 Other long term (current) drug therapy: Secondary | ICD-10-CM | POA: Diagnosis not present

## 2023-09-04 DIAGNOSIS — Z7182 Exercise counseling: Secondary | ICD-10-CM | POA: Diagnosis not present

## 2023-09-04 DIAGNOSIS — M5441 Lumbago with sciatica, right side: Secondary | ICD-10-CM | POA: Diagnosis not present

## 2023-09-04 DIAGNOSIS — M5442 Lumbago with sciatica, left side: Secondary | ICD-10-CM | POA: Diagnosis not present

## 2023-09-04 DIAGNOSIS — Z713 Dietary counseling and surveillance: Secondary | ICD-10-CM | POA: Diagnosis not present

## 2023-09-04 DIAGNOSIS — I10 Essential (primary) hypertension: Secondary | ICD-10-CM | POA: Diagnosis not present

## 2023-09-16 DIAGNOSIS — Z6831 Body mass index (BMI) 31.0-31.9, adult: Secondary | ICD-10-CM | POA: Diagnosis not present

## 2023-09-16 DIAGNOSIS — L723 Sebaceous cyst: Secondary | ICD-10-CM | POA: Diagnosis not present

## 2023-09-16 DIAGNOSIS — E669 Obesity, unspecified: Secondary | ICD-10-CM | POA: Diagnosis not present

## 2023-09-16 DIAGNOSIS — L821 Other seborrheic keratosis: Secondary | ICD-10-CM | POA: Diagnosis not present

## 2023-09-16 DIAGNOSIS — I10 Essential (primary) hypertension: Secondary | ICD-10-CM | POA: Diagnosis not present

## 2023-10-29 DIAGNOSIS — H18512 Endothelial corneal dystrophy, left eye: Secondary | ICD-10-CM | POA: Diagnosis not present

## 2023-10-29 DIAGNOSIS — Z947 Corneal transplant status: Secondary | ICD-10-CM | POA: Diagnosis not present

## 2023-10-29 DIAGNOSIS — H40001 Preglaucoma, unspecified, right eye: Secondary | ICD-10-CM | POA: Diagnosis not present

## 2023-10-29 DIAGNOSIS — Z961 Presence of intraocular lens: Secondary | ICD-10-CM | POA: Diagnosis not present

## 2023-10-29 DIAGNOSIS — H18513 Endothelial corneal dystrophy, bilateral: Secondary | ICD-10-CM | POA: Diagnosis not present

## 2023-11-28 DIAGNOSIS — E1169 Type 2 diabetes mellitus with other specified complication: Secondary | ICD-10-CM | POA: Diagnosis not present

## 2023-11-28 DIAGNOSIS — Z125 Encounter for screening for malignant neoplasm of prostate: Secondary | ICD-10-CM | POA: Diagnosis not present

## 2023-11-28 DIAGNOSIS — E782 Mixed hyperlipidemia: Secondary | ICD-10-CM | POA: Diagnosis not present

## 2023-12-08 DIAGNOSIS — I10 Essential (primary) hypertension: Secondary | ICD-10-CM | POA: Diagnosis not present

## 2023-12-08 DIAGNOSIS — M5442 Lumbago with sciatica, left side: Secondary | ICD-10-CM | POA: Diagnosis not present

## 2023-12-08 DIAGNOSIS — D3610 Benign neoplasm of peripheral nerves and autonomic nervous system, unspecified: Secondary | ICD-10-CM | POA: Diagnosis not present

## 2023-12-08 DIAGNOSIS — Z23 Encounter for immunization: Secondary | ICD-10-CM | POA: Diagnosis not present

## 2023-12-08 DIAGNOSIS — K5903 Drug induced constipation: Secondary | ICD-10-CM | POA: Diagnosis not present

## 2023-12-08 DIAGNOSIS — M5441 Lumbago with sciatica, right side: Secondary | ICD-10-CM | POA: Diagnosis not present

## 2023-12-08 DIAGNOSIS — M542 Cervicalgia: Secondary | ICD-10-CM | POA: Diagnosis not present

## 2023-12-08 DIAGNOSIS — R945 Abnormal results of liver function studies: Secondary | ICD-10-CM | POA: Diagnosis not present

## 2023-12-08 DIAGNOSIS — E1169 Type 2 diabetes mellitus with other specified complication: Secondary | ICD-10-CM | POA: Diagnosis not present

## 2023-12-08 DIAGNOSIS — E875 Hyperkalemia: Secondary | ICD-10-CM | POA: Diagnosis not present

## 2023-12-08 DIAGNOSIS — E782 Mixed hyperlipidemia: Secondary | ICD-10-CM | POA: Diagnosis not present

## 2023-12-08 DIAGNOSIS — R011 Cardiac murmur, unspecified: Secondary | ICD-10-CM | POA: Diagnosis not present

## 2024-01-15 DIAGNOSIS — H40033 Anatomical narrow angle, bilateral: Secondary | ICD-10-CM | POA: Diagnosis not present

## 2024-01-15 DIAGNOSIS — H2513 Age-related nuclear cataract, bilateral: Secondary | ICD-10-CM | POA: Diagnosis not present
